# Patient Record
Sex: Female | Born: 1980 | Race: White | Hispanic: No | Marital: Married | Smoking: Current every day smoker
Health system: Southern US, Community
[De-identification: ages and names within clinical notes are randomized; demographics above are authoritative.]

## PROBLEM LIST (undated history)

## (undated) HISTORY — PX: TONSILLECTOMY: SUR1361

---

## 2003-01-30 ENCOUNTER — Emergency Department (HOSPITAL_COMMUNITY): Admission: EM | Admit: 2003-01-30 | Discharge: 2003-01-30 | Payer: Self-pay | Admitting: Emergency Medicine

## 2004-10-16 ENCOUNTER — Emergency Department (HOSPITAL_COMMUNITY): Admission: EM | Admit: 2004-10-16 | Discharge: 2004-10-16 | Payer: Self-pay | Admitting: Emergency Medicine

## 2004-10-18 ENCOUNTER — Emergency Department (HOSPITAL_COMMUNITY): Admission: EM | Admit: 2004-10-18 | Discharge: 2004-10-18 | Payer: Self-pay | Admitting: Emergency Medicine

## 2004-11-17 ENCOUNTER — Other Ambulatory Visit: Admission: RE | Admit: 2004-11-17 | Discharge: 2004-11-17 | Payer: Self-pay | Admitting: Obstetrics and Gynecology

## 2005-07-15 ENCOUNTER — Emergency Department (HOSPITAL_COMMUNITY): Admission: EM | Admit: 2005-07-15 | Discharge: 2005-07-15 | Payer: Self-pay | Admitting: Emergency Medicine

## 2006-02-11 ENCOUNTER — Emergency Department (HOSPITAL_COMMUNITY): Admission: EM | Admit: 2006-02-11 | Discharge: 2006-02-11 | Payer: Self-pay | Admitting: Emergency Medicine

## 2006-10-19 ENCOUNTER — Emergency Department (HOSPITAL_COMMUNITY): Admission: EM | Admit: 2006-10-19 | Discharge: 2006-10-19 | Payer: Self-pay | Admitting: Emergency Medicine

## 2009-12-05 ENCOUNTER — Emergency Department (HOSPITAL_COMMUNITY): Admission: EM | Admit: 2009-12-05 | Discharge: 2009-12-05 | Payer: Self-pay | Admitting: Emergency Medicine

## 2009-12-27 ENCOUNTER — Emergency Department (HOSPITAL_COMMUNITY): Admission: EM | Admit: 2009-12-27 | Discharge: 2009-12-28 | Payer: Self-pay | Admitting: Emergency Medicine

## 2010-05-25 ENCOUNTER — Emergency Department (HOSPITAL_COMMUNITY): Admission: EM | Admit: 2010-05-25 | Discharge: 2010-05-25 | Payer: Self-pay | Admitting: Emergency Medicine

## 2010-07-21 ENCOUNTER — Emergency Department (HOSPITAL_COMMUNITY): Admission: EM | Admit: 2010-07-21 | Discharge: 2010-07-21 | Payer: Self-pay | Admitting: Emergency Medicine

## 2010-09-16 ENCOUNTER — Emergency Department (HOSPITAL_COMMUNITY): Admission: EM | Admit: 2010-09-16 | Discharge: 2010-09-16 | Payer: Self-pay | Admitting: Emergency Medicine

## 2010-09-16 ENCOUNTER — Emergency Department (HOSPITAL_COMMUNITY): Admission: EM | Admit: 2010-09-16 | Discharge: 2010-09-17 | Payer: Self-pay | Admitting: Emergency Medicine

## 2011-08-27 ENCOUNTER — Other Ambulatory Visit: Payer: Self-pay | Admitting: Family Medicine

## 2011-08-27 DIAGNOSIS — R1011 Right upper quadrant pain: Secondary | ICD-10-CM

## 2011-08-28 ENCOUNTER — Other Ambulatory Visit: Payer: Self-pay

## 2011-09-02 ENCOUNTER — Ambulatory Visit
Admission: RE | Admit: 2011-09-02 | Discharge: 2011-09-02 | Disposition: A | Discharge status: Home / Self Care | Payer: 59 | Source: Ambulatory Visit | Attending: Family Medicine | Admitting: Family Medicine

## 2011-09-02 DIAGNOSIS — R1011 Right upper quadrant pain: Secondary | ICD-10-CM

## 2011-12-10 ENCOUNTER — Ambulatory Visit: Payer: 59

## 2013-04-23 ENCOUNTER — Emergency Department (HOSPITAL_COMMUNITY)
Admission: EM | Admit: 2013-04-23 | Discharge: 2013-04-23 | Disposition: A | Discharge status: Home / Self Care | Payer: 59 | Source: Home / Self Care

## 2014-10-31 ENCOUNTER — Emergency Department (HOSPITAL_COMMUNITY)
Admission: EM | Admit: 2014-10-31 | Discharge: 2014-10-31 | Disposition: A | Discharge status: Home / Self Care | Payer: 59 | Attending: Emergency Medicine | Admitting: Emergency Medicine

## 2014-10-31 ENCOUNTER — Emergency Department (HOSPITAL_COMMUNITY): Payer: 59

## 2014-10-31 ENCOUNTER — Encounter (HOSPITAL_COMMUNITY): Payer: Self-pay | Admitting: Emergency Medicine

## 2014-10-31 DIAGNOSIS — R197 Diarrhea, unspecified: Secondary | ICD-10-CM | POA: Diagnosis present

## 2014-10-31 DIAGNOSIS — K529 Noninfective gastroenteritis and colitis, unspecified: Secondary | ICD-10-CM

## 2014-10-31 DIAGNOSIS — M549 Dorsalgia, unspecified: Secondary | ICD-10-CM | POA: Insufficient documentation

## 2014-10-31 DIAGNOSIS — Z720 Tobacco use: Secondary | ICD-10-CM | POA: Diagnosis not present

## 2014-10-31 DIAGNOSIS — R630 Anorexia: Secondary | ICD-10-CM | POA: Diagnosis not present

## 2014-10-31 DIAGNOSIS — Z7982 Long term (current) use of aspirin: Secondary | ICD-10-CM | POA: Insufficient documentation

## 2014-10-31 DIAGNOSIS — Z79899 Other long term (current) drug therapy: Secondary | ICD-10-CM | POA: Diagnosis not present

## 2014-10-31 DIAGNOSIS — R102 Pelvic and perineal pain: Secondary | ICD-10-CM

## 2014-10-31 DIAGNOSIS — K921 Melena: Secondary | ICD-10-CM | POA: Insufficient documentation

## 2014-10-31 LAB — CBC WITH DIFFERENTIAL/PLATELET
Basophils Absolute: 0 10*3/uL (ref 0.0–0.1)
Basophils Relative: 0 % (ref 0–1)
Eosinophils Absolute: 0.2 10*3/uL (ref 0.0–0.7)
Eosinophils Relative: 2 % (ref 0–5)
HCT: 34.3 % — ABNORMAL LOW (ref 36.0–46.0)
Hemoglobin: 11.6 g/dL — ABNORMAL LOW (ref 12.0–15.0)
Lymphocytes Relative: 12 % (ref 12–46)
Lymphs Abs: 1.4 10*3/uL (ref 0.7–4.0)
MCH: 29.5 pg (ref 26.0–34.0)
MCHC: 33.8 g/dL (ref 30.0–36.0)
MCV: 87.3 fL (ref 78.0–100.0)
Monocytes Absolute: 0.5 10*3/uL (ref 0.1–1.0)
Monocytes Relative: 5 % (ref 3–12)
Neutro Abs: 9.1 10*3/uL — ABNORMAL HIGH (ref 1.7–7.7)
Neutrophils Relative %: 81 % — ABNORMAL HIGH (ref 43–77)
Platelets: 275 10*3/uL (ref 150–400)
RBC: 3.93 MIL/uL (ref 3.87–5.11)
RDW: 14.3 % (ref 11.5–15.5)
WBC: 11.2 10*3/uL — ABNORMAL HIGH (ref 4.0–10.5)

## 2014-10-31 LAB — COMPREHENSIVE METABOLIC PANEL
ALT: 10 U/L (ref 0–35)
AST: 11 U/L (ref 0–37)
Albumin: 3.5 g/dL (ref 3.5–5.2)
Alkaline Phosphatase: 66 U/L (ref 39–117)
Anion gap: 7 (ref 5–15)
BUN: 6 mg/dL (ref 6–23)
CO2: 26 mmol/L (ref 19–32)
Calcium: 8.9 mg/dL (ref 8.4–10.5)
Chloride: 108 mEq/L (ref 96–112)
Creatinine, Ser: 0.77 mg/dL (ref 0.50–1.10)
GFR calc Af Amer: >90 mL/min (ref >90)
GFR calc non Af Amer: >90 mL/min (ref >90)
Glucose, Bld: 103 mg/dL — ABNORMAL HIGH (ref 70–99)
Potassium: 4.3 mmol/L (ref 3.5–5.1)
Sodium: 141 mmol/L (ref 135–145)
Total Bilirubin: 0.6 mg/dL (ref 0.3–1.2)
Total Protein: 6.5 g/dL (ref 6.0–8.3)

## 2014-10-31 LAB — I-STAT BETA HCG BLOOD, ED (MC, WL, AP ONLY): I-stat hCG, quantitative: <5 m[IU]/mL (ref <5)

## 2014-10-31 LAB — POC OCCULT BLOOD, ED: Fecal Occult Bld: POSITIVE — AB

## 2014-10-31 LAB — LIPASE, BLOOD: Lipase: 31 U/L (ref 11–59)

## 2014-10-31 MED ORDER — TRAMADOL HCL 50 MG PO TABS: 50.0000 mg | ORAL_TABLET | Freq: Four times a day (QID) | ORAL | Status: DC | PRN

## 2014-10-31 MED ORDER — SODIUM CHLORIDE 0.9 % IV BOLUS (SEPSIS)
1000.0000 mL | Freq: Once | INTRAVENOUS | Status: AC
  Administered 2014-10-31: 1000 mL via INTRAVENOUS

## 2014-10-31 MED ORDER — IOHEXOL 300 MG/ML  SOLN: 25.0000 mL | Freq: Once | INTRAMUSCULAR | Status: DC | PRN

## 2014-10-31 MED ORDER — METRONIDAZOLE 500 MG PO TABS: 500.0000 mg | ORAL_TABLET | Freq: Two times a day (BID) | ORAL | Status: DC

## 2014-10-31 MED ORDER — ONDANSETRON HCL 4 MG/2ML IJ SOLN
4.0000 mg | Freq: Once | INTRAMUSCULAR | Status: AC | PRN
  Administered 2014-10-31: 4 mg via INTRAVENOUS
  Filled 2014-10-31: qty 2

## 2014-10-31 MED ORDER — ONDANSETRON HCL 4 MG/2ML IJ SOLN: 4.0000 mg | Freq: Once | INTRAMUSCULAR | Status: DC

## 2014-10-31 MED ORDER — MORPHINE SULFATE 4 MG/ML IJ SOLN
4.0000 mg | Freq: Once | INTRAMUSCULAR | Status: AC
  Administered 2014-10-31: 4 mg via INTRAVENOUS
  Filled 2014-10-31: qty 1

## 2014-10-31 MED ORDER — CIPROFLOXACIN HCL 500 MG PO TABS: 500.0000 mg | ORAL_TABLET | Freq: Two times a day (BID) | ORAL | Status: DC

## 2014-10-31 MED ORDER — PROMETHAZINE HCL 25 MG PO TABS
25.0000 mg | ORAL_TABLET | Freq: Four times a day (QID) | ORAL | Status: DC | PRN
Start: 2014-10-31 — End: 2018-08-07

## 2014-10-31 MED ORDER — NAPROXEN 500 MG PO TABS: 500.0000 mg | ORAL_TABLET | Freq: Two times a day (BID) | ORAL | Status: DC

## 2014-10-31 MED ORDER — IOHEXOL 300 MG/ML  SOLN
100.0000 mL | Freq: Once | INTRAMUSCULAR | Status: AC | PRN
  Administered 2014-10-31: 100 mL via INTRAVENOUS

## 2014-10-31 MED ORDER — DIPHENHYDRAMINE HCL 50 MG/ML IJ SOLN
25.0000 mg | Freq: Once | INTRAMUSCULAR | Status: AC
  Administered 2014-10-31: 25 mg via INTRAVENOUS
  Filled 2014-10-31: qty 1

## 2014-10-31 MED ORDER — METOCLOPRAMIDE HCL 5 MG/ML IJ SOLN
10.0000 mg | Freq: Once | INTRAMUSCULAR | Status: AC
  Administered 2014-10-31: 10 mg via INTRAVENOUS
  Filled 2014-10-31: qty 2

## 2014-10-31 NOTE — ED Notes (Signed)
Patient has not returned from US.  

## 2014-10-31 NOTE — ED Notes (Signed)
N/V/D and headache starting yesterday; 20 rounds of diarrhea since yesterday; was having diarrhea yesterday, but felt constipated, so took ex-lax yesterday. Bright red bleeding in stool. Lower abdominal cramping.  Took home pregnancy test yesterday and got positive results.

## 2014-10-31 NOTE — ED Notes (Signed)
Notified CT she drank a little more than half of the PO contrast and states she will not drink anymore.

## 2014-10-31 NOTE — ED Notes (Signed)
Patient returned from Ultrasound. 

## 2014-10-31 NOTE — ED Notes (Signed)
Went in to room to round and update vitals. Patient arguing with visitor in the room, states she is ready to leave. Offered to call CT and get a timeframe for her and she states to get the IV out, she is ready to go. Notified Amid[place]rin, [place]. She stated she would wait 5 more minutes maximum for CT. Transporter arrived at that time. She would not allow nurse to update vitals. States she is not putting cuff or finger probe back on.

## 2014-10-31 NOTE — ED Notes (Signed)
Patient transported to CT 

## 2014-10-31 NOTE — ED Provider Notes (Signed)
CSN: [id number]     Arrival date & time 10/31/14  [id number] History   First MD Initiated Contact with Patient 10/31/14 [phone]     Chief Complaint  Patient presents with  . Diarrhea     (Consider location/radiation/quality/duration/timing/severity/associated sxs/prior Treatment) HPI Pt is a 33yo female presenting to ED with c/o n/v/d that started yesterday.  Pt reports having diarrhea but also felt constipated so she took ex-lax.  Reports about 20 rounds of diarrhea since yesterday, 5-6 today.  Pt unsure if her symptoms seem to be improving or not.  Reports bright red blood in stool and lower abdominal cramping that is worse in pelvic area, 5/10 at rest, but 10/10 just prior to having to use bathroom.  Yesterday she had 3 episodes of non-bloody, non-bilious emesis, none today.  Denies sick contacts or recent travel. Denies recent use of antibiotics. LMP "last month" but reports taking a home pregnancy test yesterday, positive result.  States she has had 2 prior pregnancies w/o complications.  States she just had recent pelvic exam, tx for BV. Denies concern for STD.  History reviewed. No pertinent past medical history. History reviewed. No pertinent past surgical history. History reviewed. No pertinent family history. History  Substance Use Topics  . Smoking status: Current Every Day Smoker -- 0.50 packs/day    Types: Cigarettes  . Smokeless tobacco: Not on file  . Alcohol Use: No   OB History    No data available     Review of Systems  Constitutional: Positive for appetite change. Negative for fever, chills and fatigue.  Respiratory: Negative for cough and shortness of breath.   Cardiovascular: Negative for chest pain and palpitations.  Gastrointestinal: Positive for nausea, vomiting, abdominal pain, diarrhea, constipation and blood in stool. Negative for anal bleeding and rectal pain.  Genitourinary: Positive for pelvic pain. Negative for dysuria, urgency, hematuria, flank pain, decreased  urine volume, vaginal bleeding, vaginal discharge and vaginal pain.  Musculoskeletal: Positive for back pain. Negative for myalgias.  All other systems reviewed and are negative.     Allergies  Review of patient's allergies indicates no known allergies.  Home Medications   Prior to Admission medications   Medication Sig Start Date End Date Taking? Authorizing Provider  Aspirin-Salicylamide-Caffeine (BC HEADACHE POWDER PO) Take 1 packet by mouth daily as needed (headache).   Yes Historical Provider, MD  INTUNIV 1 MG TB24 Take 1 mg by mouth daily. 10/12/14  Yes Historical Provider, MD  omeprazole (PRILOSEC) 20 MG capsule Take 20 mg by mouth 2 (two) times daily as needed. 10/24/14  Yes Historical Provider, MD  propranolol (INDERAL) 10 MG tablet Take 10 mg by mouth 3 (three) times daily as needed (anxiety).  10/12/14  Yes Historical Provider, MD  traZODone (DESYREL) 50 MG tablet Take 50 mg by mouth at bedtime as needed. 08/30/14  Yes Historical Provider, MD  venlafaxine XR (EFFEXOR-XR) 150 MG 24 hr capsule Take 150 mg by mouth daily. 10/29/14  Yes Historical Provider, MD  ciprofloxacin (CIPRO) 500 MG tablet Take 1 tablet (500 mg total) by mouth 2 (two) times daily. One po bid x 7 days 10/31/14   Junius FinnerErin O'Malley, PA-C  metroNIDAZOLE (FLAGYL) 500 MG tablet Take 1 tablet (500 mg total) by mouth 2 (two) times daily. One po bid x 7 days 10/31/14   Junius FinnerErin O'Malley, PA-C  naproxen (NAPROSYN) 500 MG tablet Take 1 tablet (500 mg total) by mouth 2 (two) times daily. 10/31/14   Junius FinnerErin O'Malley, PA-C  promethazine (PHENERGAN) 25  MG tablet Take 1 tablet (25 mg total) by mouth every 6 (six) hours as needed for nausea or vomiting. 10/31/14   Junius FinnerErin O'Malley, PA-C  traMADol (ULTRAM) 50 MG tablet Take 1 tablet (50 mg total) by mouth every 6 (six) hours as needed. 10/31/14   Junius FinnerErin O'Malley, PA-C   BP 114/58 mmHg  Pulse 68  Temp(Src) 98.7 F (37.1 C) (Oral)  Resp 18  Ht 5\' 7"  (1.702 m)  Wt 209 lb (94.802 kg)  BMI  32.73 kg/m2  SpO2 98%  LMP  (Within Months) Physical Exam  Constitutional: She appears well-developed and well-nourished. No distress.  Pt lying in exam bed, NAD. Hydrated, non-toxic appearing.  HENT:  Head: Normocephalic and atraumatic.  Mouth/Throat: Mucous membranes are normal.  Eyes: Conjunctivae are normal. No scleral icterus.  Neck: Normal range of motion.  Cardiovascular: Normal rate, regular rhythm and normal heart sounds.   Pulmonary/Chest: Effort normal and breath sounds normal. No respiratory distress. She has no wheezes. She has no rales. She exhibits no tenderness.  Abdominal: Soft. She exhibits no distension and no mass. Bowel sounds are increased. There is tenderness. There is no rebound and no guarding.  Soft, non-distended, diffuse tenderness, worse in lower abdomen and LLQ  Genitourinary: Guaiac positive stool. Cervix exhibits no motion tenderness, no discharge and no friability. Right adnexum displays tenderness. Right adnexum displays no mass and no fullness. Left adnexum displays tenderness. Left adnexum displays no mass and no fullness. There is bleeding in the vagina. No erythema or tenderness in the vagina. No foreign body around the vagina. No signs of injury around the vagina. No vaginal discharge found.  Chaperoned exam. Normal external genital exam.  Vaginal canal: small to moderate amount of dark red blood.  No CMT, left and right adnexal tenderness w/o masses. Rectal exam: unremarkable, no gross red blood.  Musculoskeletal: Normal range of motion.  Neurological: She is alert.  Skin: Skin is warm and dry. She is not diaphoretic.  Nursing note and vitals reviewed.   ED Course  Procedures (including critical care time) Labs Review Labs Reviewed  CBC WITH DIFFERENTIAL - Abnormal; Notable for the following:    WBC 11.2 (*)    Hemoglobin 11.6 (*)    HCT 34.3 (*)    Neutrophils Relative % 81 (*)    Neutro Abs 9.1 (*)    All other components within normal  limits  COMPREHENSIVE METABOLIC PANEL - Abnormal; Notable for the following:    Glucose, Bld 103 (*)    All other components within normal limits  POC OCCULT BLOOD, ED - Abnormal; Notable for the following:    Fecal Occult Bld POSITIVE (*)    All other components within normal limits  LIPASE, BLOOD  I-STAT BETA HCG BLOOD, ED ([facility], [facility], [facility] ONLY)    Imaging Review [place] Transvaginal Non-ob  10/31/2014   CLINICAL DATA:  Pelvic pain since yesterday. Patient reports positive home pregnancy test, however negative beta HCG.  EXAM: TRANSABDOMINAL AND TRANSVAGINAL ULTRASOUND OF PELVIS  TECHNIQUE: Both transabdominal and transvaginal ultrasound examinations of the pelvis were performed. Transabdominal technique was performed for global imaging of the pelvis including uterus, ovaries, adnexal regions, and pelvic cul-de-sac. It was necessary to proceed with endovaginal exam following the transabdominal exam to visualize the uterus and both ovaries.  COMPARISON:  None  FINDINGS: Uterus  Measurements: 7.8 x 4.1 x 5.5 cm. No fibroids or other mass visualized.  Endometrium  Thickness: 5-7 mm. No focal abnormality visualized. There is no fluid  in the endometrial canal. No intrauterine gestational sac.  Right ovary  Measurements: 3.1 x 2.0 x 2.3 cm. Normal appearance/no adnexal mass. Normal blood flow is seen.  Left ovary  Measurements: 3.6 x 3.1 x 2.1 cm. Normal appearance/no adnexal mass. Normal blood flow is seen.  Other findings  No free fluid.  IMPRESSION: Normal pelvic ultrasound.   Electronically Signed   By: Rubye Oaks M.D.   On: 10/31/2014 12:38   US Pelvis Complete  10/31/2014   CLINICAL DATA:  Pelvic pain since yesterday. Patient reports positive home pregnancy test, however negative beta HCG.  EXAM: TRANSABDOMINAL AND TRANSVAGINAL ULTRASOUND OF PELVIS  TECHNIQUE: Both transabdominal and transvaginal ultrasound examinations of the pelvis were performed. Transabdominal technique was performed for global  imaging of the pelvis including uterus, ovaries, adnexal regions, and pelvic cul-de-sac. It was necessary to proceed with endovaginal exam following the transabdominal exam to visualize the uterus and both ovaries.  COMPARISON:  None  FINDINGS: Uterus  Measurements: 7.8 x 4.1 x 5.5 cm. No fibroids or other mass visualized.  Endometrium  Thickness: 5-7 mm. No focal abnormality visualized. There is no fluid in the endometrial canal. No intrauterine gestational sac.  Right ovary  Measurements: 3.1 x 2.0 x 2.3 cm. Normal appearance/no adnexal mass. Normal blood flow is seen.  Left ovary  Measurements: 3.6 x 3.1 x 2.1 cm. Normal appearance/no adnexal mass. Normal blood flow is seen.  Other findings  No free fluid.  IMPRESSION: Normal pelvic ultrasound.   Electronically Signed   By: Rubye Oaks M.D.   On: 10/31/2014 12:38   Ct Abdomen Pelvis W Contrast  10/31/2014   CLINICAL DATA:  Nausea, vomiting and diarrhea beginning 1 day ago.  EXAM: CT ABDOMEN AND PELVIS WITH CONTRAST  TECHNIQUE: Multidetector CT imaging of the abdomen and pelvis was performed using the standard protocol following bolus administration of intravenous contrast.  CONTRAST:  100 mL OMNIPAQUE IOHEXOL 300 MG/ML  SOLN  COMPARISON:  None.  FINDINGS: The lung bases are clear. No pleural or pericardial effusion. Heart size is normal.  The gallbladder, liver, spleen, adrenal glands, pancreas and kidneys appear normal. The walls of the descending colon are thickened throughout with surrounding inflammatory change. The remainder of the colon, including the sigmoid, appears spared. The appendix, stomach and small bowel are unremarkable. A small amount of free pelvic fluid is identified. There is no abscess, pneumatosis, portal venous gas or free intraperitoneal air. No lymphadenopathy is identified. Uterus, adnexa and urinary bladder appear normal. No focal bony abnormality is identified. A few small foci of calcific atherosclerosis are seen in the  descending abdominal aorta.  IMPRESSION: Findings consistent with infectious or inflammatory colitis of the descending colon. Negative for abscess or perforation.  Mild calcific aortic atherosclerosis.   Electronically Signed   By: Drusilla Kanner M.D.   On: 10/31/2014 14:44     EKG Interpretation None      MDM   Final diagnoses:  Pelvic pain in female  Diarrhea  Hematochezia  Gastroenteritis    Pt is a 33yo female presenting to ED with c/o abdominal pain, n/v/d that started yesterday as well as positive home pregnancy test. Pt felt constipated while having the diarrhea, reports bright red blood in stool.  Pt reported noticing vaginal discharge in ED while giving urine sample. On exam, pt has diffuse abdominal tenderness, worse in lower abdomen and LLQ.   DDx: ectopic pregnancy, TOA, ovarian torsion, diverticulitis, gastroenteritis, SBO lower concern as abd is soft and  non-distended.  istat beta Hcg- negative.  Pt may have had a false positive home pregnancy test.  Pelvic exam: significant for small to moderate amount of dark red blood.  Will still get pelvic U/S.  While in ED pt had had 2 BMs, c/o severe pain with her BMs.  Hemoccult: positive. CT abd ordered as pt does not appear to be pregnant at this time and still having severe abdominal pain.   Pelvic U/S: normal  CT abd: findings c/w infectious or inflammatory colitis of descending colon. Negative for abscess or perforation.   Due to pt's pain and frequency of diarrhea, will tx as infectious gastroenteritis. Pt is hemodynamically stable for discharge home. She has been able to keep down PO fluids, no vomiting in ED.  Rx: cipro, flagyl, phenergan, tramadol and naproxen.  Discussed with pt to repeat a home pregnancy test or have PCP repeat test in 1-2 weeks as blood test in ED was negative today. Home care instructions provided. Return precautions provided. Pt verbalized understanding and agreement with tx plan.   Junius Finner,  PA-C 10/31/14 1604  Rolan Bucco, MD 10/31/14 [phone]

## 2014-10-31 NOTE — ED Notes (Signed)
Given PO contrast to drink.

## 2014-10-31 NOTE — ED Notes (Signed)
Patient returned from CT

## 2014-10-31 NOTE — ED Notes (Signed)
Patient transported to Ultrasound 

## 2014-12-07 ENCOUNTER — Telehealth: Payer: Self-pay

## 2014-12-07 NOTE — Telephone Encounter (Signed)
Patient had called for directions for GI appointment Front desk had already accommodated their request

## 2015-02-09 ENCOUNTER — Encounter (HOSPITAL_COMMUNITY): Payer: Self-pay | Admitting: Adult Health

## 2015-02-09 ENCOUNTER — Emergency Department (HOSPITAL_COMMUNITY)
Admission: EM | Admit: 2015-02-09 | Discharge: 2015-02-10 | Disposition: A | Discharge status: Home / Self Care | Payer: 59 | Attending: Emergency Medicine | Admitting: Emergency Medicine

## 2015-02-09 DIAGNOSIS — Z79899 Other long term (current) drug therapy: Secondary | ICD-10-CM | POA: Diagnosis not present

## 2015-02-09 DIAGNOSIS — Z791 Long term (current) use of non-steroidal anti-inflammatories (NSAID): Secondary | ICD-10-CM | POA: Insufficient documentation

## 2015-02-09 DIAGNOSIS — W228XXA Striking against or struck by other objects, initial encounter: Secondary | ICD-10-CM | POA: Diagnosis not present

## 2015-02-09 DIAGNOSIS — Z8659 Personal history of other mental and behavioral disorders: Secondary | ICD-10-CM | POA: Insufficient documentation

## 2015-02-09 DIAGNOSIS — Z23 Encounter for immunization: Secondary | ICD-10-CM | POA: Diagnosis not present

## 2015-02-09 DIAGNOSIS — S01112A Laceration without foreign body of left eyelid and periocular area, initial encounter: Secondary | ICD-10-CM | POA: Diagnosis not present

## 2015-02-09 DIAGNOSIS — Y939 Activity, unspecified: Secondary | ICD-10-CM | POA: Diagnosis not present

## 2015-02-09 DIAGNOSIS — Y929 Unspecified place or not applicable: Secondary | ICD-10-CM | POA: Insufficient documentation

## 2015-02-09 DIAGNOSIS — S0512XA Contusion of eyeball and orbital tissues, left eye, initial encounter: Secondary | ICD-10-CM | POA: Diagnosis not present

## 2015-02-09 DIAGNOSIS — Z720 Tobacco use: Secondary | ICD-10-CM | POA: Insufficient documentation

## 2015-02-09 DIAGNOSIS — Y999 Unspecified external cause status: Secondary | ICD-10-CM | POA: Diagnosis not present

## 2015-02-09 DIAGNOSIS — K219 Gastro-esophageal reflux disease without esophagitis: Secondary | ICD-10-CM | POA: Insufficient documentation

## 2015-02-09 DIAGNOSIS — S0592XA Unspecified injury of left eye and orbit, initial encounter: Secondary | ICD-10-CM | POA: Diagnosis present

## 2015-02-09 MED ORDER — TETANUS-DIPHTH-ACELL PERTUSSIS 5-2.5-18.5 LF-MCG/0.5 IM SUSP
0.5000 mL | Freq: Once | INTRAMUSCULAR | Status: AC
  Administered 2015-02-10: 0.5 mL via INTRAMUSCULAR
  Filled 2015-02-09: qty 0.5
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
## 2015-02-10 MED ORDER — HYDROCODONE-ACETAMINOPHEN 5-325 MG PO TABS
1.0000 | ORAL_TABLET | Freq: Once | ORAL | Status: AC
  Administered 2015-02-10: 1 via ORAL
  Filled 2015-02-10: qty 1

## 2015-02-10 MED ORDER — LIDOCAINE-EPINEPHRINE (PF) 2 %-1:200000 IJ SOLN
10.0000 mL | Freq: Once | INTRAMUSCULAR | Status: AC
  Administered 2015-02-10: 10 mL
  Filled 2015-02-10: qty 20

## 2015-02-10 MED ORDER — TRAMADOL HCL 50 MG PO TABS: 50.0000 mg | ORAL_TABLET | Freq: Four times a day (QID) | ORAL | Status: DC | PRN
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
## 2015-02-10 NOTE — Discharge Instructions (Signed)
Facial Laceration ° A facial laceration is a cut on the face. These injuries can be painful and cause bleeding. Lacerations usually heal quickly, but they need special care to reduce scarring. °DIAGNOSIS  °Your health care provider will take a medical history, ask for details about how the injury occurred, and examine the wound to determine how deep the cut is. °TREATMENT  °Some facial lacerations may not require closure. Others may not be able to be closed because of an increased risk of infection. The risk of infection and the chance for successful closure will depend on various factors, including the amount of time since the injury occurred. °The wound may be cleaned to help prevent infection. If closure is appropriate, pain medicines may be given if needed. Your health care provider will use stitches (sutures), wound glue (adhesive), or skin adhesive strips to repair the laceration. These tools bring the skin edges together to allow for faster healing and a better cosmetic outcome. If needed, you may also be given a tetanus shot. °HOME CARE INSTRUCTIONS °· Only take over-the-counter or prescription medicines as directed by your health care provider. °· Follow your health care provider's instructions for wound care. These instructions will vary depending on the technique used for closing the wound. °For Sutures: °· Keep the wound clean and dry.   °· If you were given a bandage (dressing), you should change it at least once a day. Also change the dressing if it becomes wet or dirty, or as directed by your health care provider.   °· Wash the wound with soap and water 2 times a day. Rinse the wound off with water to remove all soap. Pat the wound dry with a clean towel.   °· After cleaning, apply a thin layer of the antibiotic ointment recommended by your health care provider. This will help prevent infection and keep the dressing from sticking.   °· You may shower as usual after the first 24 hours. Do not soak the  wound in water until the sutures are removed.   °· Get your sutures removed as directed by your health care provider. With facial lacerations, sutures should usually be taken out after 4-5 days to avoid stitch marks.   °· Wait a few days after your sutures are removed before applying any makeup. °For Skin Adhesive Strips: °· Keep the wound clean and dry.   °· Do not get the skin adhesive strips wet. You may bathe carefully, using caution to keep the wound dry.   °· If the wound gets wet, pat it dry with a clean towel.   °· Skin adhesive strips will fall off on their own. You may trim the strips as the wound heals. Do not remove skin adhesive strips that are still stuck to the wound. They will fall off in time.   °For Wound Adhesive: °· You may briefly wet your wound in the shower or bath. Do not soak or scrub the wound. Do not swim. Avoid periods of heavy sweating until the skin adhesive has fallen off on its own. After showering or bathing, gently pat the wound dry with a clean towel.   °· Do not apply liquid medicine, cream medicine, ointment medicine, or makeup to your wound while the skin adhesive is in place. This may loosen the film before your wound is healed.   °· If a dressing is placed over the wound, be careful not to apply tape directly over the skin adhesive. This may cause the adhesive to be pulled off before the wound is healed.   °· Avoid   prolonged exposure to sunlight or tanning lamps while the skin adhesive is in place.  The skin adhesive will usually remain in place for 5-10 days, then naturally fall off the skin. Do not pick at the adhesive film.  After Healing: Once the wound has healed, cover the wound with sunscreen during the day for 1 full year. This can help minimize scarring. Exposure to ultraviolet light in the first year will darken the scar. It can take 1-2 years for the scar to lose its redness and to heal completely.  SEEK IMMEDIATE MEDICAL CARE IF:  You have redness, pain, or  swelling around the wound.   You see ayellowish-white fluid (pus) coming from the wound.   You have chills or a fever.  MAKE SURE YOU:  Understand these instructions.  Will watch your condition.  Will get help right away if you are not doing well or get worse. Document Released: 12/03/2004 Document Revised: 08/16/2013 Document Reviewed: 06/08/2013 [facility] Patient Information 2015 [place], [place]. This information is not intended to replace advice given to you by your health care provider. Make sure you discuss any questions you have with your health care provider.  Facial or Scalp Contusion A facial or scalp contusion is a deep bruise on the face or head. Injuries to the face and head generally cause a lot of swelling, especially around the eyes. Contusions are the result of an injury that caused bleeding under the skin. The contusion may turn blue, purple, or yellow. Minor injuries will give you a painless contusion, but more severe contusions may stay painful and swollen for a few weeks.  CAUSES  A facial or scalp contusion is caused by a blunt injury or trauma to the face or head area.  SIGNS AND SYMPTOMS   Swelling of the injured area.   Discoloration of the injured area.   Tenderness, soreness, or pain in the injured area.  DIAGNOSIS  The diagnosis can be made by taking a medical history and doing a physical exam. An X-ray exam, CT scan, or MRI may be needed to determine if there are any associated injuries, such as broken bones (fractures). TREATMENT  Often, the best treatment for a facial or scalp contusion is applying cold compresses to the injured area. Over-the-counter medicines may also be recommended for pain control.  HOME CARE INSTRUCTIONS   Only take over-the-counter or prescription medicines as directed by your health care provider.   Apply ice to the injured area.   Put ice in a plastic bag.   Place a towel between your skin and the bag.   Leave  the ice on for 20 minutes, 2-3 times a day.  SEEK MEDICAL CARE IF:  You have bite problems.   You have pain with chewing.   You are concerned about facial defects. SEEK IMMEDIATE MEDICAL CARE IF:  You have severe pain or a headache that is not relieved by medicine.   You have unusual sleepiness, confusion, or personality changes.   You throw up (vomit).   You have a persistent nosebleed.   You have double vision or blurred vision.   You have fluid drainage from your nose or ear.   You have difficulty walking or using your arms or legs.  MAKE SURE YOU:   Understand these instructions.  Will watch your condition.  Will get help right away if you are not doing well or get worse. Document Released: 12/03/2004 Document Revised: 08/16/2013 Document Reviewed: 06/08/2013 [facility] Patient Information 2015 [place], [place].  This information is not intended to replace advice given to you by your health care provider. Make sure you discuss any questions you have with your health care provider.  Hematoma A hematoma is a collection of blood under the skin, in an organ, in a body space, in a joint space, or in other tissue. The blood can clot to form a lump that you can see and feel. The lump is often firm and may sometimes become sore and tender. Most hematomas get better in a few days to weeks. However, some hematomas may be serious and require medical care. Hematomas can range in size from very small to very large. CAUSES  A hematoma can be caused by a blunt or penetrating injury. It can also be caused by spontaneous leakage from a blood vessel under the skin. Spontaneous leakage from a blood vessel is more likely to occur in older people, especially those taking blood thinners. Sometimes, a hematoma can develop after certain medical procedures. SIGNS AND SYMPTOMS   A firm lump on the body.  Possible pain and tenderness in the area.  Bruising.Blue, dark blue, purple-red, or  yellowish skin may appear at the site of the hematoma if the hematoma is close to the surface of the skin. For hematomas in deeper tissues or body spaces, the signs and symptoms may be subtle. For example, an intra-abdominal hematoma may cause abdominal pain, weakness, fainting, and shortness of breath. An intracranial hematoma may cause a headache or symptoms such as weakness, trouble speaking, or a change in consciousness. DIAGNOSIS  A hematoma can usually be diagnosed based on your medical history and a physical exam. Imaging tests may be needed if your health care provider suspects a hematoma in deeper tissues or body spaces, such as the abdomen, head, or chest. These tests may include ultrasonography or a CT scan.  TREATMENT  Hematomas usually go away on their own over time. Rarely does the blood need to be drained out of the body. Large hematomas or those that may affect vital organs will sometimes need surgical drainage or monitoring. HOME CARE INSTRUCTIONS   Apply ice to the injured area:   Put ice in a plastic bag.   Place a towel between your skin and the bag.   Leave the ice on for 20 minutes, 2-3 times a day for the first 1 to 2 days.   After the first 2 days, switch to using warm compresses on the hematoma.   Elevate the injured area to help decrease pain and swelling. Wrapping the area with an elastic bandage may also be helpful. Compression helps to reduce swelling and promotes shrinking of the hematoma. Make sure the bandage is not wrapped too tight.   If your hematoma is on a lower extremity and is painful, crutches may be helpful for a couple days.   Only take over-the-counter or prescription medicines as directed by your health care provider. SEEK IMMEDIATE MEDICAL CARE IF:   You have increasing pain, or your pain is not controlled with medicine.   You have a fever.   You have worsening swelling or discoloration.   Your skin over the hematoma breaks or  starts bleeding.   Your hematoma is in your chest or abdomen and you have weakness, shortness of breath, or a change in consciousness.  Your hematoma is on your scalp (caused by a fall or injury) and you have a worsening headache or a change in alertness or consciousness. MAKE SURE YOU:   Understand  these instructions.  Will watch your condition.  Will get help right away if you are not doing well or get worse. Document Released: 06/09/2004 Document Revised: 06/28/2013 Document Reviewed: 04/05/2013 [facility] Patient Information 2015 [place], [place]. This information is not intended to replace advice given to you by your health care provider. Make sure you discuss any questions you have with your health care provider.

## 2016-01-24 ENCOUNTER — Emergency Department (HOSPITAL_COMMUNITY)
Admission: EM | Admit: 2016-01-24 | Discharge: 2016-01-24 | Disposition: A | Discharge status: Home / Self Care | Payer: 59 | Attending: Emergency Medicine | Admitting: Emergency Medicine

## 2016-01-24 ENCOUNTER — Encounter (HOSPITAL_COMMUNITY): Payer: Self-pay | Admitting: *Deleted

## 2016-01-24 DIAGNOSIS — R6889 Other general symptoms and signs: Secondary | ICD-10-CM

## 2016-01-24 DIAGNOSIS — Z880 Allergy status to penicillin: Secondary | ICD-10-CM | POA: Insufficient documentation

## 2016-01-24 DIAGNOSIS — Z791 Long term (current) use of non-steroidal anti-inflammatories (NSAID): Secondary | ICD-10-CM | POA: Diagnosis not present

## 2016-01-24 DIAGNOSIS — R05 Cough: Secondary | ICD-10-CM | POA: Diagnosis not present

## 2016-01-24 DIAGNOSIS — J029 Acute pharyngitis, unspecified: Secondary | ICD-10-CM | POA: Diagnosis not present

## 2016-01-24 DIAGNOSIS — Z79899 Other long term (current) drug therapy: Secondary | ICD-10-CM | POA: Insufficient documentation

## 2016-01-24 DIAGNOSIS — R110 Nausea: Secondary | ICD-10-CM | POA: Insufficient documentation

## 2016-01-24 DIAGNOSIS — F17210 Nicotine dependence, cigarettes, uncomplicated: Secondary | ICD-10-CM | POA: Diagnosis not present

## 2016-01-24 DIAGNOSIS — R6883 Chills (without fever): Secondary | ICD-10-CM | POA: Insufficient documentation

## 2016-01-24 DIAGNOSIS — R51 Headache: Secondary | ICD-10-CM | POA: Diagnosis not present

## 2016-01-24 DIAGNOSIS — M791 Myalgia: Secondary | ICD-10-CM | POA: Diagnosis not present

## 2016-01-24 DIAGNOSIS — R197 Diarrhea, unspecified: Secondary | ICD-10-CM | POA: Diagnosis not present

## 2016-01-24 MED ORDER — IBUPROFEN 800 MG PO TABS: 800.0000 mg | ORAL_TABLET | Freq: Three times a day (TID) | ORAL | Status: DC

## 2016-01-24 MED ORDER — ONDANSETRON 4 MG PO TBDP: 4.0000 mg | ORAL_TABLET | Freq: Three times a day (TID) | ORAL | Status: DC | PRN

## 2016-01-24 MED ORDER — BENZONATATE 100 MG PO CAPS: 100.0000 mg | ORAL_CAPSULE | Freq: Three times a day (TID) | ORAL | Status: DC

## 2016-01-24 MED ORDER — KETOROLAC TROMETHAMINE 60 MG/2ML IM SOLN
60.0000 mg | Freq: Once | INTRAMUSCULAR | Status: AC
  Administered 2016-01-24: 60 mg via INTRAMUSCULAR
  Filled 2016-01-24: qty 2

## 2016-01-24 NOTE — ED Provider Notes (Signed)
CSN: [id number]     Arrival date & time 01/24/16  1530 History  By signing my name below, I, Tina Warren, attest that this documentation has been prepared under the direction and in the presence of Raiyah Speakman, PA-C. Electronically Signed: Tanda Warren, ED Scribe. 01/24/2016. 4:45 PM.   Chief Complaint  Patient presents with  . Generalized Body Aches  . Nausea   The history is provided by the patient. No language interpreter was used.     HPI Comments: Tina Warren is a 35 y.o. female who presents to the Emergency Department complaining of gradual onset, constant, body aches x 1 day. Pt also complains of chills, sore throat, nonproductive cough, nausea, diarrhea, and headache. Headache is bilateral frontal, rates it 7/10, nonradiating. She estimates 3-4 episodes of diarrhea in the past 24 hours. Pt has been taking Tylenol without relief. Recent sick contact with similar symptoms. Denies vomiting, dysuria, abdominal pain, shortness of breath, chest pain, or any other associated symptoms.   History reviewed. No pertinent past medical history. History reviewed. No pertinent past surgical history. No family history on file. Social History  Substance Use Topics  . Smoking status: Current Every Day Smoker -- 0.50 packs/day    Types: Cigarettes  . Smokeless tobacco: None  . Alcohol Use: No   OB History    No data available     Review of Systems  Constitutional: Positive for chills. Negative for fever.  HENT: Positive for sore throat.   Respiratory: Positive for cough.   Gastrointestinal: Positive for nausea and diarrhea. Negative for vomiting.  Genitourinary: Negative for dysuria.  Musculoskeletal: Positive for myalgias.  Neurological: Positive for headaches.  All other systems reviewed and are negative.  Allergies  Penicillins  Home Medications   Prior to Admission medications   Medication Sig Start Date End Date Taking? Authorizing Provider  Aspirin-Salicylamide-Caffeine  (BC HEADACHE POWDER PO) Take 1 packet by mouth daily as needed (headache).    Historical Provider, MD  benzonatate (TESSALON) 100 MG capsule Take 1 capsule (100 mg total) by mouth every 8 (eight) hours. 01/24/16   Maleek Craver C Lindora Alviar, PA-C  ciprofloxacin (CIPRO) 500 MG tablet Take 1 tablet (500 mg total) by mouth 2 (two) times daily. One po bid x 7 days 10/31/14   Junius Finner, PA-C  ibuprofen (ADVIL,MOTRIN) 800 MG tablet Take 1 tablet (800 mg total) by mouth 3 (three) times daily. 01/24/16   Harshal Sirmon C Teonia Yager, PA-C  INTUNIV 1 MG TB24 Take 1 mg by mouth daily. 10/12/14   Historical Provider, MD  metroNIDAZOLE (FLAGYL) 500 MG tablet Take 1 tablet (500 mg total) by mouth 2 (two) times daily. One po bid x 7 days 10/31/14   Junius Finner, PA-C  naproxen (NAPROSYN) 500 MG tablet Take 1 tablet (500 mg total) by mouth 2 (two) times daily. 10/31/14   Junius Finner, PA-C  omeprazole (PRILOSEC) 20 MG capsule Take 20 mg by mouth 2 (two) times daily as needed. 10/24/14   Historical Provider, MD  ondansetron (ZOFRAN ODT) 4 MG disintegrating tablet Take 1 tablet (4 mg total) by mouth every 8 (eight) hours as needed for nausea or vomiting. 01/24/16   Wilgus Deyton C Cassidy Tashiro, PA-C  promethazine (PHENERGAN) 25 MG tablet Take 1 tablet (25 mg total) by mouth every 6 (six) hours as needed for nausea or vomiting. 10/31/14   Junius Finner, PA-C  propranolol (INDERAL) 10 MG tablet Take 10 mg by mouth 3 (three) times daily as needed (anxiety).  10/12/14   Historical Provider,  MD  traMADol (ULTRAM) 50 MG tablet Take 1 tablet (50 mg total) by mouth every 6 (six) hours as needed. 10/31/14   Junius FinnerErin O'Malley, PA-C  traMADol (ULTRAM) 50 MG tablet Take 1 tablet (50 mg total) by mouth every 6 (six) hours as needed. 02/10/15   Tiffany Neva SeatGreene, PA-C  traZODone (DESYREL) 50 MG tablet Take 50 mg by mouth at bedtime as needed. 08/30/14   Historical Provider, MD  venlafaxine XR (EFFEXOR-XR) 150 MG 24 hr capsule Take 150 mg by mouth daily. 10/29/14   Historical Provider, MD    BP 104/64 mmHg  Pulse 80  Temp(Src) 97.9 F (36.6 C) (Oral)  Resp 18  SpO2 100%  LMP 01/16/2016   Physical Exam  Constitutional: She is oriented to person, place, and time. She appears well-developed and well-nourished. No distress.  HENT:  Head: Normocephalic and atraumatic.  Mouth/Throat: Oropharynx is clear and moist.  Eyes: Conjunctivae and EOM are normal. Pupils are equal, round, and reactive to light.  Neck: Normal range of motion. Neck supple.  No cervical lymphadenopathy.   Cardiovascular: Normal rate, regular rhythm, normal heart sounds and intact distal pulses.   Pulmonary/Chest: Effort normal and breath sounds normal. No respiratory distress. She has no wheezes. She has no rhonchi. She has no rales.  Abdominal: Soft. Bowel sounds are normal. There is no tenderness. There is no guarding.  Musculoskeletal: She exhibits no edema or tenderness.  Lymphadenopathy:    She has no cervical adenopathy.  Neurological: She is alert and oriented to person, place, and time. She has normal reflexes.  No sensory deficits. Strength 5/5 in all extremities. No gait disturbance. Coordination intact. Cranial nerves III-XII grossly intact. No facial droop.   Skin: Skin is warm and dry. She is not diaphoretic.  Psychiatric: She has a normal mood and affect. Her behavior is normal.  Nursing note and vitals reviewed.   ED Course  Procedures (including critical care time)  DIAGNOSTIC STUDIES: Oxygen Saturation is 100% on RA, normal by my interpretation.    COORDINATION OF CARE: 4:43 PM-Discussed treatment plan which includes symptomatic treatment with pt at bedside and pt agreed to plan.   Labs Review Labs Reviewed - No data to display  Imaging Review No results found.   EKG Interpretation None      MDM   Final diagnoses:  Flu-like symptoms   Tina SinclairSara Warren presents with flulike symptoms since yesterday.  Patient's presentation is consistent with a viral illness, such as  influenza. No indications for imaging or labs at this time. Patient is nontoxic appearing, afebrile, not tachycardic, not tachypneic, maintains SPO2 of 100% on room air, and is in no apparent distress. Patient has no signs of sepsis or other serious or life-threatening condition. Home symptomatic care and return precautions discussed. Patient voiced understanding of these instructions and is comfortable with discharge.  I personally performed the services described in this documentation, which was scribed in my presence. The recorded information has been reviewed and is accurate.   Anselm PancoastShawn C Nyrah Demos, PA-C 01/24/16 1719  Linwood DibblesJon Knapp, MD 01/25/16 1349

## 2016-01-24 NOTE — ED Notes (Signed)
Yesterday developed body aches, sore throat, nausea

## 2016-01-24 NOTE — Discharge Instructions (Signed)
You have been seen today for flulike symptoms. Your symptoms are consistent with a viral illness. Viruses do not require antibiotics. Treatment is symptomatic care. Drink plenty of fluids and get plenty of rest. Ibuprofen or Tylenol for pain or fever. Zofran for nausea. Tessalon for cough. Plain Mucinex may help relieve congestion. Warm liquids or Chloraseptic spray may help soothe the sore throat. Follow up with PCP as needed. Return to ED should symptoms worsen.  RESOURCE GUIDE  Chronic Pain Problems: Contact [facility]  [phone] Patients need to be referred by their primary care doctor.  Insufficient Money for Medicine: Contact United Way:  call "211" or [facility] [phone].  No Primary Care Doctor: - Call Health Connect  [phone] - can help you locate a primary care doctor that  accepts your insurance, provides certain services, etc. - Physician Referral Service- [phone]  Agencies that provide inexpensive medical care: - [facility]  [phone] - [facility]  [phone] - [facility]  [phone] - Women's Clinic  [phone] - Planned Parenthood  [phone] Haynes Bast[facility]  [phone]  Medicaid-accepting [facility] Providers: - [facility]- [address], [address]  [phone], Mon-Fri 9am-7pm, Sat 9am-1pm - [facility]- [address], [address] [place]  [phone] - [facility]- [address], [address] [place]  [phone] [facility]- [address]  [phone] - Renaye RakersVeita Bland- [address], [address], [phone]  Only accepts [place] patients after they have their name  applied to their card  Self Pay (no insurance) in [place]: - Sickle Cell Patients: Dr Willey BladeEric Dean, [facility]  [address], [phone] - [facility]- [address]  [phone]        Redge Gainer-     [facility]- [address], [address]       -     [facility]- see information above (Speak to CitigroupPam H if you do not have insurance)       -  [facility]- [address], [phone]       -  [facility]- [address],  [phone]       -  [facility]- [address], [phone]       -  Dr Julio Sickssei-Bonsu-  [address], [address], [place], [phone]       -  [facility]- [address], [phone]       -  [facility]- [address], [phone], also [address], [phone]       -    [place] [facility]- [address], [phone], 1st & 3rd Saturday   every month, 10am-1pm  1) Find a Doctor and Pay Out of Pocket Although you won't have to find out who is covered by your insurance plan, it is a good idea to ask around and get recommendations. You will then need to call the office and see if the doctor you have chosen will accept you as a new patient and what types of options they offer for patients who are self-pay. Some doctors offer discounts or will set up payment plans for their patients who do not have insurance, but you will need to ask so you aren't surprised  when you get to your appointment.  2) Contact Your Local Health Department Not all health departments have doctors that can see patients for sick visits, but many do, so it is worth a call to see if yours does. If you don't know where your local health department is, you can check in your phone book. The CDC also has a tool to help you locate your state's health department, and many state websites also have listings of all of their local health departments.  3) Find a Walk-in Clinic If your illness is not likely to be very severe or complicated, you may want to try a walk in clinic. These are popping up all over the country in pharmacies, drugstores, and shopping centers. They're usually staffed by nurse practitioners or  physician assistants that have been trained to treat common illnesses and complaints. They're usually fairly quick and inexpensive. However, if you have serious medical issues or chronic medical problems, these are probably not your best option  STD Testing - [facility], STD Clinic, [address] [place], phone [phone] or [phone].  Monday - Friday, call for an appointment. [facility] [place] [facility] [employer], STD Clinic, [address], [place], phone [phone] or [phone].  Monday - Friday, call for an appointment.  Abuse/Neglect: [facility] Child Abuse Hotline 351-229-5404 [facility] Child Abuse Hotline 418-146-9241 (After Hours)  Emergency Shelter:  [facility] [phone]  Maternity Homes: - [facility] [phone] - [facility] [phone]  MRSA Hotline #:   318 171 2455  [facility] Resources  [facility]  United [employer] [facility] Dept. [address]                 [address]         [address]  Blondell Reveal Phone:  [phone]                                  Phone:  [phone]                   Phone:  [phone]  [facility], [phone] - [phone]       -     [facility] in [place], [address],                                  [phone], [facility] Child Abuse Hotline 202-304-2926 or [phone]) [phone] (After Hours)   Behavioral Health Services  Substance Abuse Resources: - [facility]  [phone] - [facility] [phone] - [facility] [phone] Floydene Flock [phone] - Residential & Outpatient  Substance Abuse Program  [phone]  Psychological  Services: - [place], [phone] [address] [place], [place]: [phone] or [phone], PicCapture.uy  Dental Assistance  If unable to pay or uninsured, contact:  [facility] or [facility]. to become qualified for the adult dental clinic.  Patients with Medicaid: [facility] [phone], [place] [address], [phone]  If unable to pay, or uninsured, contact [facility] [phone]) or [place] [phone] in [place], [place] in [facility]) to become qualified for the adult dental clinic   Other [place]- [place], [place], [place], [address], [place], [place], 2nd and 4th Thursday of the month at 6:30am.  10 clients each day by appointment, can sometimes see walk-in patients if someone does not show for an appointment. [facility]- [address] Hillard Danker [place], [place], [address], [place], [place], [place], [address], [place] Department- [place] Department- [place] Department- [phone]

## 2017-10-25 ENCOUNTER — Encounter (HOSPITAL_COMMUNITY): Payer: Self-pay | Admitting: Family Medicine

## 2017-10-25 ENCOUNTER — Emergency Department (HOSPITAL_COMMUNITY)
Admission: EM | Admit: 2017-10-25 | Discharge: 2017-10-26 | Disposition: A | Discharge status: Home / Self Care | Payer: 59 | Attending: Emergency Medicine | Admitting: Emergency Medicine

## 2017-10-25 DIAGNOSIS — Z5321 Procedure and treatment not carried out due to patient leaving prior to being seen by health care provider: Secondary | ICD-10-CM | POA: Insufficient documentation

## 2017-10-25 DIAGNOSIS — R51 Headache: Secondary | ICD-10-CM | POA: Diagnosis present

## 2017-10-25 NOTE — ED Triage Notes (Signed)
Patient is complaining of multiple complaints. She reports having URI symptoms of sore throat, ear pain, cough, left shoulder pain, and headache with dizziness.

## 2017-10-25 NOTE — ED Notes (Signed)
Pt told registration that they were leaving.  °

## 2018-02-27 ENCOUNTER — Encounter (HOSPITAL_COMMUNITY): Payer: Self-pay | Admitting: *Deleted

## 2018-02-27 ENCOUNTER — Emergency Department (HOSPITAL_COMMUNITY): Payer: 59

## 2018-02-27 DIAGNOSIS — F17210 Nicotine dependence, cigarettes, uncomplicated: Secondary | ICD-10-CM | POA: Insufficient documentation

## 2018-02-27 DIAGNOSIS — R072 Precordial pain: Secondary | ICD-10-CM | POA: Insufficient documentation

## 2018-02-27 DIAGNOSIS — R0600 Dyspnea, unspecified: Secondary | ICD-10-CM | POA: Insufficient documentation

## 2018-02-27 DIAGNOSIS — Z79899 Other long term (current) drug therapy: Secondary | ICD-10-CM | POA: Insufficient documentation

## 2018-02-27 NOTE — ED Triage Notes (Signed)
Pt stated "I felt dizzy yesterday afternoon, got up during the night and my feet hurt, I feel SOB, have lower back pain, my neck and left shoulder hurt.  My whole neck feels stiff."

## 2018-02-28 ENCOUNTER — Emergency Department (HOSPITAL_COMMUNITY)
Admission: EM | Admit: 2018-02-28 | Discharge: 2018-02-28 | Disposition: A | Discharge status: Home / Self Care | Payer: 59 | Attending: Emergency Medicine | Admitting: Emergency Medicine

## 2018-02-28 DIAGNOSIS — R0600 Dyspnea, unspecified: Secondary | ICD-10-CM

## 2018-02-28 DIAGNOSIS — R072 Precordial pain: Secondary | ICD-10-CM

## 2018-02-28 LAB — COMPREHENSIVE METABOLIC PANEL
ALT: 18 U/L (ref 14–54)
ANION GAP: 11 (ref 5–15)
AST: 16 U/L (ref 15–41)
Albumin: 3.4 g/dL — ABNORMAL LOW (ref 3.5–5.0)
Alkaline Phosphatase: 57 U/L (ref 38–126)
BUN: 15 mg/dL (ref 6–20)
CALCIUM: 8.6 mg/dL — AB (ref 8.9–10.3)
CO2: 21 mmol/L — AB (ref 22–32)
Chloride: 108 mmol/L (ref 101–111)
Creatinine, Ser: 0.65 mg/dL (ref 0.44–1.00)
GFR calc non Af Amer: >60 mL/min (ref >60)
GLUCOSE: 94 mg/dL (ref 65–99)
Potassium: 3.6 mmol/L (ref 3.5–5.1)
Sodium: 140 mmol/L (ref 135–145)
Total Bilirubin: 0.5 mg/dL (ref 0.3–1.2)
Total Protein: 6.6 g/dL (ref 6.5–8.1)

## 2018-02-28 LAB — CBC
HCT: 32.6 % — ABNORMAL LOW (ref 36.0–46.0)
HEMOGLOBIN: 10.9 g/dL — AB (ref 12.0–15.0)
MCH: 28.8 pg (ref 26.0–34.0)
MCHC: 33.4 g/dL (ref 30.0–36.0)
MCV: 86 fL (ref 78.0–100.0)
Platelets: 271 10*3/uL (ref 150–400)
RBC: 3.79 MIL/uL — AB (ref 3.87–5.11)
RDW: 14.6 % (ref 11.5–15.5)
WBC: 9.1 10*3/uL (ref 4.0–10.5)

## 2018-02-28 LAB — TROPONIN I: Troponin I: <0.03 ng/mL (ref <0.03)

## 2018-02-28 LAB — D-DIMER, QUANTITATIVE (NOT AT ARMC): D DIMER QUANT: 0.32 ug{FEU}/mL (ref 0.00–0.50)

## 2018-02-28 MED ORDER — ACETAMINOPHEN 325 MG PO TABS
650.0000 mg | ORAL_TABLET | Freq: Once | ORAL | Status: AC
  Administered 2018-02-28: 650 mg via ORAL
  Filled 2018-02-28: qty 2

## 2018-02-28 NOTE — ED Provider Notes (Signed)
[facility] Provider Note   CSN: [id number] Arrival date & time: 02/27/18  2023     History   Chief Complaint Chief Complaint  Patient presents with  . Shortness of Breath  . Back Pain  . Foot Pain    bilateral  . Gastroesophageal Reflux    HPI Tina Warren is a 37 y.o. female.  HPI Patient is a relatively healthy 37 year old female presents the emergency department with complaints of feeling somewhat lightheaded yesterday afternoon followed by development of precordial chest pain and mild shortness of breath with pain into her left neck and left shoulder.  She also reports mild low back pain.  No dysuria or urinary frequency.  She denies weakness of her arms or legs.  No difficulty with speech.  Mild headache at this time.  No fevers at home.  No history of DVT or pulmonary embolism.  No history of cardiac or pulmonary disease.  No family history of early cardiac disease.  No recent travel or surgery.  No unilateral leg swelling.   History reviewed. No pertinent past medical history.  There are no active problems to display for this patient.   Past Surgical History:  Procedure Laterality Date  . TONSILLECTOMY       OB History   None      Home Medications    Prior to Admission medications   Medication Sig Start Date End Date Taking? Authorizing Provider  Aspirin-Salicylamide-Caffeine (BC HEADACHE POWDER PO) Take 1 packet by mouth daily as needed (headache).    [provider]  benzonatate (TESSALON) 100 MG capsule Take 1 capsule (100 mg total) by mouth every 8 (eight) hours. 01/24/16   Joy, Shawn C, PA-C  ciprofloxacin (CIPRO) 500 MG tablet Take 1 tablet (500 mg total) by mouth 2 (two) times daily. One po bid x 7 days 10/31/14   Lurene Shadow, PA-C  ibuprofen (ADVIL,MOTRIN) 800 MG tablet Take 1 tablet (800 mg total) by mouth 3 (three) times daily. 01/24/16   Joy, Shawn C, PA-C  INTUNIV 1 MG TB24 Take 1 mg by mouth daily.  10/12/14   [provider]  metroNIDAZOLE (FLAGYL) 500 MG tablet Take 1 tablet (500 mg total) by mouth 2 (two) times daily. One po bid x 7 days 10/31/14   Lurene Shadow, PA-C  naproxen (NAPROSYN) 500 MG tablet Take 1 tablet (500 mg total) by mouth 2 (two) times daily. 10/31/14   Lurene Shadow, PA-C  omeprazole (PRILOSEC) 20 MG capsule Take 20 mg by mouth 2 (two) times daily as needed. 10/24/14   [provider]  ondansetron (ZOFRAN ODT) 4 MG disintegrating tablet Take 1 tablet (4 mg total) by mouth every 8 (eight) hours as needed for nausea or vomiting. 01/24/16   Joy, Shawn C, PA-C  promethazine (PHENERGAN) 25 MG tablet Take 1 tablet (25 mg total) by mouth every 6 (six) hours as needed for nausea or vomiting. 10/31/14   Lurene Shadow, PA-C  propranolol (INDERAL) 10 MG tablet Take 10 mg by mouth 3 (three) times daily as needed (anxiety).  10/12/14   [provider]  traMADol (ULTRAM) 50 MG tablet Take 1 tablet (50 mg total) by mouth every 6 (six) hours as needed. 10/31/14   Lurene Shadow, PA-C  traMADol (ULTRAM) 50 MG tablet Take 1 tablet (50 mg total) by mouth every 6 (six) hours as needed. 02/10/15   Marlon Pel, PA-C  traZODone (DESYREL) 50 MG tablet Take 50 mg by  mouth at bedtime as needed. 08/30/14   [provider]  venlafaxine XR (EFFEXOR-XR) 150 MG 24 hr capsule Take 150 mg by mouth daily. 10/29/14   [provider]    Family History No family history on file.  Social History Social History   Tobacco Use  . Smoking status: Current Every Day Smoker    Packs/day: 0.25    Types: Cigarettes  . Smokeless tobacco: Never Used  Substance Use Topics  . Alcohol use: Yes    Comment: rarely  . Drug use: No     Allergies   Penicillins   Review of Systems Review of Systems  All other systems reviewed and are negative.    Physical Exam Updated Vital Signs BP (!) 126/91 (BP Location: Left Arm)   Pulse 78   Temp 98.2 F (36.8 C)  (Oral)   Resp 18   Ht 5\' 5"  (1.651 m)   Wt 106 kg (233 lb 9.6 oz)   LMP 01/26/2018 (Approximate)   SpO2 99%   BMI 38.87 kg/m   Physical Exam  Constitutional: She is oriented to person, place, and time. She appears well-developed and well-nourished. No distress.  HENT:  Head: Normocephalic and atraumatic.  Eyes: EOM are normal.  Neck: Normal range of motion.  Cardiovascular: Normal rate, regular rhythm and normal heart sounds.  Pulmonary/Chest: Effort normal and breath sounds normal.  Abdominal: Soft. She exhibits no distension. There is no tenderness.  Musculoskeletal: Normal range of motion.  Neurological: She is alert and oriented to person, place, and time.  Skin: Skin is warm and dry.  Psychiatric: She has a normal mood and affect. Judgment normal.  Nursing note and vitals reviewed.    ED Treatments / Results  Labs (all labs ordered are listed, but only abnormal results are displayed)  Labs Reviewed  CBC - Abnormal; Notable for the following components:      Result Value   RBC 3.79 (*)    Hemoglobin 10.9 (*)    HCT 32.6 (*)    All other components within normal limits  COMPREHENSIVE METABOLIC PANEL - Abnormal; Notable for the following components:   CO2 21 (*)    Calcium 8.6 (*)    Albumin 3.4 (*)    All other components within normal limits  TROPONIN I  D-DIMER, QUANTITATIVE (NOT AT [facility])      EKG EKG Interpretation  Date/Time:  Sunday February 27 2018 20:42:40 EDT Ventricular Rate:  84 PR Interval:    QRS Duration: 76 QT Interval:  371 QTC Calculation: 439 R Axis:   66 Text Interpretation:  Sinus rhythm No old tracing to compare Confirmed by Azalia Bilisampos, Nechama Escutia ([id number]) on 02/28/2018 1:48:33 AM   Radiology Dg Chest 2 View  Result Date: 02/27/2018 CLINICAL DATA:  Shortness of breath, left chest pain, smoker EXAM: CHEST - 2 VIEW COMPARISON:  10/19/2006 FINDINGS: Heart and mediastinal contours are within normal limits. No focal opacities or effusions. No acute  bony abnormality. IMPRESSION: No active cardiopulmonary disease. Electronically Signed   By: Charlett NoseKevin  Dover M.D.   On: 02/27/2018 21:16    Procedures Procedures (including critical care time)  Medications Ordered in ED Medications  acetaminophen (TYLENOL) tablet 650 mg (650 mg Oral Given 02/28/18 0327)     Initial Impression / Assessment and Plan / ED Course  I have reviewed the triage vital signs and the nursing notes.  Pertinent labs & imaging results that were available during my care of the patient were reviewed by me and  considered in my medical decision making (see chart for details).     Considerations for atypical ACS however less likely.  Also in the differential is pneumonia versus PE versus bronchitis versus musculoskeletal pain  EKG without ischemic changes.  Pain is been constant for 6-8 hours.  Initial troponin is negative.  Given the constant and prolonged nature of her pain a single troponin is all that is needed.  D-dimer negative.  Chest x-ray without infiltrate.  Vital signs are stable.  Labs are normal including hemoglobin of 10.9 and a normal BUN and creatinine.  Atypical presentation.  Unclear etiology.  Likely musculoskeletal and/or gastroesophageal reflux disease.  Otherwise well-appearing and stable for discharge.  I do not think she needs additional testing or admission to the hospital this time.  Close primary care follow-up.  Patient understands return to the emergency department for new or worsening symptoms  Final Clinical Impressions(s) / ED Diagnoses   Final diagnoses:  Precordial chest pain  Dyspnea, unspecified type    ED Discharge Orders    None       Azalia Bilis, MD 02/28/18 [phone]

## 2018-05-14 ENCOUNTER — Other Ambulatory Visit: Payer: Self-pay

## 2018-05-14 ENCOUNTER — Emergency Department (HOSPITAL_COMMUNITY)
Admission: EM | Admit: 2018-05-14 | Discharge: 2018-05-15 | Disposition: A | Discharge status: Home / Self Care | Payer: 59 | Attending: Emergency Medicine | Admitting: Emergency Medicine

## 2018-05-14 ENCOUNTER — Encounter (HOSPITAL_COMMUNITY): Payer: Self-pay | Admitting: *Deleted

## 2018-05-14 DIAGNOSIS — R519 Headache, unspecified: Secondary | ICD-10-CM

## 2018-05-14 DIAGNOSIS — Z79899 Other long term (current) drug therapy: Secondary | ICD-10-CM | POA: Insufficient documentation

## 2018-05-14 DIAGNOSIS — R51 Headache: Secondary | ICD-10-CM | POA: Insufficient documentation

## 2018-05-14 DIAGNOSIS — F17210 Nicotine dependence, cigarettes, uncomplicated: Secondary | ICD-10-CM | POA: Insufficient documentation

## 2018-05-14 NOTE — ED Triage Notes (Signed)
Pt crying & stated "I lost my job today and I really don't have a place to go.  I can't stay with my daughter because she's a student @ [employer]."  Pt denies SI/HI.  C/o h/a & nausea.

## 2018-05-15 MED ORDER — DIPHENHYDRAMINE HCL 50 MG/ML IJ SOLN
25.0000 mg | Freq: Once | INTRAMUSCULAR | Status: AC
  Administered 2018-05-15: 25 mg via INTRAMUSCULAR
  Filled 2018-05-15: qty 1

## 2018-05-15 MED ORDER — PROCHLORPERAZINE EDISYLATE 10 MG/2ML IJ SOLN
10.0000 mg | Freq: Once | INTRAMUSCULAR | Status: AC
  Administered 2018-05-15: 10 mg via INTRAMUSCULAR
  Filled 2018-05-15: qty 2

## 2018-05-15 MED ORDER — DEXAMETHASONE 2 MG PO TABS
10.0000 mg | ORAL_TABLET | Freq: Once | ORAL | Status: AC
  Administered 2018-05-15: 10 mg via ORAL
  Filled 2018-05-15: qty 2

## 2018-05-15 NOTE — ED Provider Notes (Signed)
[facility]-EMERGENCY DEPT Provider Note   CSN: [id number] Arrival date & time: 05/14/18  2134     History   Chief Complaint Chief Complaint  Patient presents with  . Headache    HPI Tina Warren is a 37 y.o. female.  37 yo F with a chief complaint of a headache.  This started this morning was slow in onset and gradually worsened throughout the day.  The patient unfortunately has been under some stress at home she was recently fired from her job.  Describes the headache as dull and throbbing.  Worse with bright lights.  Denies fevers or cough congestion.  Denies trauma.  Denies neck pain.  Denies unilateral numbness or weakness denies difficulty with speech or swallowing.  The history is provided by the patient.  Headache   This is a recurrent problem. The current episode started yesterday. The problem occurs constantly. The problem has not changed since onset.The headache is associated with nothing. The pain is at a severity of 7/10. The pain is mild. The pain does not radiate. Pertinent negatives include no fever, no palpitations, no shortness of breath, no nausea and no vomiting. She has tried nothing for the symptoms. The treatment provided no relief.    History reviewed. No pertinent past medical history.  There are no active problems to display for this patient.   Past Surgical History:  Procedure Laterality Date  . TONSILLECTOMY       OB History   None      Home Medications    Prior to Admission medications   Medication Sig Start Date End Date Taking? Authorizing Provider  Aspirin-Salicylamide-Caffeine (BC HEADACHE POWDER PO) Take 1 packet by mouth daily as needed (headache).    [provider]  benzonatate (TESSALON) 100 MG capsule Take 1 capsule (100 mg total) by mouth every 8 (eight) hours. 01/24/16   Joy, Shawn C, PA-C  ciprofloxacin (CIPRO) 500 MG tablet Take 1 tablet (500 mg total) by mouth 2 (two) times daily. One po bid x 7  days 10/31/14   Lurene Shadow, PA-C  ibuprofen (ADVIL,MOTRIN) 800 MG tablet Take 1 tablet (800 mg total) by mouth 3 (three) times daily. 01/24/16   Joy, Shawn C, PA-C  INTUNIV 1 MG TB24 Take 1 mg by mouth daily. 10/12/14   [provider]  metroNIDAZOLE (FLAGYL) 500 MG tablet Take 1 tablet (500 mg total) by mouth 2 (two) times daily. One po bid x 7 days 10/31/14   Lurene Shadow, PA-C  naproxen (NAPROSYN) 500 MG tablet Take 1 tablet (500 mg total) by mouth 2 (two) times daily. 10/31/14   Lurene Shadow, PA-C  omeprazole (PRILOSEC) 20 MG capsule Take 20 mg by mouth 2 (two) times daily as needed. 10/24/14   [provider]  ondansetron (ZOFRAN ODT) 4 MG disintegrating tablet Take 1 tablet (4 mg total) by mouth every 8 (eight) hours as needed for nausea or vomiting. 01/24/16   Joy, Shawn C, PA-C  promethazine (PHENERGAN) 25 MG tablet Take 1 tablet (25 mg total) by mouth every 6 (six) hours as needed for nausea or vomiting. 10/31/14   Lurene Shadow, PA-C  propranolol (INDERAL) 10 MG tablet Take 10 mg by mouth 3 (three) times daily as needed (anxiety).  10/12/14   [provider]  traMADol (ULTRAM) 50 MG tablet Take 1 tablet (50 mg total) by mouth every 6 (six) hours as needed. 10/31/14   Lurene Shadow, PA-C  traMADol Janean Sark) 50  MG tablet Take 1 tablet (50 mg total) by mouth every 6 (six) hours as needed. 02/10/15   Marlon PelGreene, Tiffany, PA-C  traZODone (DESYREL) 50 MG tablet Take 50 mg by mouth at bedtime as needed. 08/30/14   [provider]  venlafaxine XR (EFFEXOR-XR) 150 MG 24 hr capsule Take 150 mg by mouth daily. 10/29/14   [provider]    Family History No family history on file.  Social History Social History   Tobacco Use  . Smoking status: Current Every Day Smoker    Packs/day: 0.25    Types: Cigarettes  . Smokeless tobacco: Never Used  Substance Use Topics  . Alcohol use: Yes    Comment: rarely  . Drug use: No     Allergies     Penicillins   Review of Systems Review of Systems  Constitutional: Negative for chills and fever.  HENT: Negative for congestion and rhinorrhea.   Eyes: Negative for redness and visual disturbance.  Respiratory: Negative for shortness of breath and wheezing.   Cardiovascular: Negative for chest pain and palpitations.  Gastrointestinal: Negative for nausea and vomiting.  Genitourinary: Negative for dysuria and urgency.  Musculoskeletal: Negative for arthralgias and myalgias.  Skin: Negative for pallor and wound.  Neurological: Positive for headaches. Negative for dizziness.     Physical Exam Updated Vital Signs BP (!) 104/51   Pulse 85   Temp 98.4 F (36.9 C) (Oral)   Resp 16   Ht 5\' 5"  (1.651 m)   Wt 103.8 kg (228 lb 14.4 oz)   LMP 05/06/2018 (Approximate)   SpO2 98%   BMI 38.09 kg/m   Physical Exam  Constitutional: She is oriented to person, place, and time. She appears well-developed and well-nourished. No distress.  HENT:  Head: Normocephalic and atraumatic.  Eyes: Pupils are equal, round, and reactive to light. EOM are normal.  Neck: Normal range of motion. Neck supple.  Cardiovascular: Normal rate and regular rhythm. Exam reveals no gallop and no friction rub.  No murmur heard. Pulmonary/Chest: Effort normal. She has no wheezes. She has no rales.  Abdominal: Soft. She exhibits no distension. There is no tenderness.  Musculoskeletal: She exhibits no edema or tenderness.  Neurological: She is alert and oriented to person, place, and time. She has normal strength. No cranial nerve deficit or sensory deficit. She displays a negative Romberg sign. Coordination and gait normal.  Benign neuro exam  Skin: Skin is warm and dry. She is not diaphoretic.  Psychiatric: She has a normal mood and affect. Her behavior is normal.  Nursing note and vitals reviewed.    ED Treatments / Results  Labs (all labs ordered are listed, but only abnormal results are displayed) Labs  Reviewed - No data to display  EKG None  Radiology No results found.  Procedures Procedures (including critical care time)  Medications Ordered in ED Medications  prochlorperazine (COMPAZINE) injection 10 mg (10 mg Intramuscular Given 05/15/18 0028)  diphenhydrAMINE (BENADRYL) injection 25 mg (25 mg Intramuscular Given 05/15/18 0028)  dexamethasone (DECADRON) tablet 10 mg (10 mg Oral Given 05/15/18 0026)     Initial Impression / Assessment and Plan / ED Course  I have reviewed the triage vital signs and the nursing notes.  Pertinent labs & imaging results that were available during my care of the patient were reviewed by me and considered in my medical decision making (see chart for details).     37 yo F with a cc of a headache.  No red  flags.  Benign neuro exam.  Will give a headache cocktail and reassess.  Headache is feeling better.  Discharge home.  2:22 AM:  I have discussed the diagnosis/risks/treatment options with the patient and believe the pt to be eligible for discharge home to follow-up with PCP. We also discussed returning to the ED immediately if new or worsening sx occur. We discussed the sx which are most concerning (e.g., sudden worsening pain, fever, inability to tolerate by mouth) that necessitate immediate return. Medications administered to the patient during their visit and any new prescriptions provided to the patient are listed below.  Medications given during this visit Medications  prochlorperazine (COMPAZINE) injection 10 mg (10 mg Intramuscular Given 05/15/18 0028)  diphenhydrAMINE (BENADRYL) injection 25 mg (25 mg Intramuscular Given 05/15/18 0028)  dexamethasone (DECADRON) tablet 10 mg (10 mg Oral Given 05/15/18 0026)      The patient appears reasonably screen and/or stabilized for discharge and I doubt any other medical condition or other [facility] requiring further screening, evaluation, or treatment in the ED at this time prior to discharge.    Final  Clinical Impressions(s) / ED Diagnoses   Final diagnoses:  Bad headache    ED Discharge Orders        Ordered    Ambulatory referral to Neurology    Comments:  Headache syndrome   05/15/18 0129       Melene Plan, DO 05/15/18 0222

## 2018-06-15 ENCOUNTER — Emergency Department (HOSPITAL_COMMUNITY)
Admission: EM | Admit: 2018-06-15 | Discharge: 2018-06-15 | Disposition: A | Discharge status: Home / Self Care | Payer: 59 | Attending: Emergency Medicine | Admitting: Emergency Medicine

## 2018-06-15 ENCOUNTER — Encounter (HOSPITAL_COMMUNITY): Payer: Self-pay | Admitting: Emergency Medicine

## 2018-06-15 ENCOUNTER — Other Ambulatory Visit: Payer: Self-pay

## 2018-06-15 DIAGNOSIS — F17210 Nicotine dependence, cigarettes, uncomplicated: Secondary | ICD-10-CM | POA: Diagnosis not present

## 2018-06-15 DIAGNOSIS — Z79899 Other long term (current) drug therapy: Secondary | ICD-10-CM | POA: Insufficient documentation

## 2018-06-15 DIAGNOSIS — R21 Rash and other nonspecific skin eruption: Secondary | ICD-10-CM

## 2018-06-15 LAB — CBC WITH DIFFERENTIAL/PLATELET
Basophils Absolute: 0 10*3/uL (ref 0.0–0.1)
Basophils Relative: 1 %
Eosinophils Absolute: 0.2 10*3/uL (ref 0.0–0.7)
Eosinophils Relative: 2 %
HCT: 35.1 % — ABNORMAL LOW (ref 36.0–46.0)
HEMOGLOBIN: 11.9 g/dL — AB (ref 12.0–15.0)
Lymphocytes Relative: 28 %
Lymphs Abs: 2.4 10*3/uL (ref 0.7–4.0)
MCH: 29 pg (ref 26.0–34.0)
MCHC: 33.9 g/dL (ref 30.0–36.0)
MCV: 85.4 fL (ref 78.0–100.0)
MONOS PCT: 4 %
Monocytes Absolute: 0.4 10*3/uL (ref 0.1–1.0)
NEUTROS ABS: 5.7 10*3/uL (ref 1.7–7.7)
NEUTROS PCT: 65 %
Platelets: 339 10*3/uL (ref 150–400)
RBC: 4.11 MIL/uL (ref 3.87–5.11)
RDW: 14.4 % (ref 11.5–15.5)
WBC: 8.7 10*3/uL (ref 4.0–10.5)

## 2018-06-15 MED ORDER — TRIAMCINOLONE ACETONIDE 0.1 % EX CREA: 1.0000 "application " | TOPICAL_CREAM | Freq: Two times a day (BID) | CUTANEOUS | 0 refills | Status: DC

## 2018-06-15 NOTE — ED Triage Notes (Signed)
Patient c/o painful rash to right buttock x2 days. Reports hx shingles. States rash recurs during times of stress and remains on only the right side.

## 2018-06-15 NOTE — ED Provider Notes (Signed)
[facility]-EMERGENCY DEPT Provider Note  CSN: [id number] Arrival date & time: 06/15/18  1709  History   Chief Complaint Chief Complaint  Patient presents with  . Rash    HPI Tina Warren is a 37 y.o. female with no significant medical history who presented to the ED for rash x2 days. Patient describes a recurrent blister-like rash on her buttock and low back area that appears mostly with stress. She states that it is itchy and burns. Patient states that the area resolves on its own after a few days. During this outbreak, she has tried nothing prior to coming to the ED. Denies fever, chills, arthralgias or other skin rashes/lesions. Denies recent sick contacts, new allergens, exposures, recent travel or animal/insect bites. Patient reports being treated for shingles in the past ~ 10 years ago and was prescribed Famvir in 2015 for the rash.  History reviewed. No pertinent past medical history.  There are no active problems to display for this patient.   Past Surgical History:  Procedure Laterality Date  . TONSILLECTOMY       OB History   None      Home Medications    Prior to Admission medications   Medication Sig Start Date End Date Taking? Authorizing Provider  Aspirin-Caffeine (BC FAST PAIN RELIEF PO) Take 1 packet by mouth daily as needed (pain).   Yes [provider]  venlafaxine XR (EFFEXOR-XR) 150 MG 24 hr capsule Take 150 mg by mouth daily. 10/29/14  Yes [provider]  benzonatate (TESSALON) 100 MG capsule Take 1 capsule (100 mg total) by mouth every 8 (eight) hours. Patient not taking: Reported on 06/15/2018 01/24/16   Anselm Pancoast, PA-C  ciprofloxacin (CIPRO) 500 MG tablet Take 1 tablet (500 mg total) by mouth 2 (two) times daily. One po bid x 7 days Patient not taking: Reported on 06/15/2018 10/31/14   Lurene Shadow, PA-C  ibuprofen (ADVIL,MOTRIN) 800 MG tablet Take 1 tablet (800 mg total) by mouth 3 (three) times daily. Patient  not taking: Reported on 06/15/2018 01/24/16   Joy, Ines Bloomer C, PA-C  metroNIDAZOLE (FLAGYL) 500 MG tablet Take 1 tablet (500 mg total) by mouth 2 (two) times daily. One po bid x 7 days Patient not taking: Reported on 06/15/2018 10/31/14   Lurene Shadow, PA-C  naproxen (NAPROSYN) 500 MG tablet Take 1 tablet (500 mg total) by mouth 2 (two) times daily. Patient not taking: Reported on 06/15/2018 10/31/14   Lurene Shadow, PA-C  ondansetron (ZOFRAN ODT) 4 MG disintegrating tablet Take 1 tablet (4 mg total) by mouth every 8 (eight) hours as needed for nausea or vomiting. Patient not taking: Reported on 06/15/2018 01/24/16   Anselm Pancoast, PA-C  promethazine (PHENERGAN) 25 MG tablet Take 1 tablet (25 mg total) by mouth every 6 (six) hours as needed for nausea or vomiting. Patient not taking: Reported on 06/15/2018 10/31/14   Lurene Shadow, PA-C  traMADol (ULTRAM) 50 MG tablet Take 1 tablet (50 mg total) by mouth every 6 (six) hours as needed. Patient not taking: Reported on 06/15/2018 10/31/14   Lurene Shadow, PA-C  traMADol (ULTRAM) 50 MG tablet Take 1 tablet (50 mg total) by mouth every 6 (six) hours as needed. Patient not taking: Reported on 06/15/2018 02/10/15   Marlon Pel, PA-C  triamcinolone cream (KENALOG) 0.1 % Apply 1 application topically 2 (two) times daily. 06/15/18   Gemini Bunte, Sharyon Medicus, PA-C    Family History No family history on  file.  Social History Social History   Tobacco Use  . Smoking status: Current Every Day Smoker    Packs/day: 0.25    Types: Cigarettes  . Smokeless tobacco: Never Used  Substance Use Topics  . Alcohol use: Yes    Comment: rarely  . Drug use: No     Allergies   Codeine and Penicillins   Review of Systems Review of Systems  Constitutional: Negative for chills, fatigue and fever.  Gastrointestinal: Negative.   Genitourinary: Negative.   Musculoskeletal: Negative.   Skin: Positive for rash. Negative for wound.  Hematological: Negative.      Physical  Exam Updated Vital Signs BP (!) 137/91 (BP Location: Left Arm)   Pulse 79   Temp 99.7 F (37.6 C) (Oral)   Resp 16   Ht 5\' 5"  (1.651 m)   Wt 102.3 kg (225 lb 9 oz)   LMP 06/07/2018   SpO2 99%   BMI 37.54 kg/m   Physical Exam  Constitutional: Vital signs are normal. She appears well-developed and well-nourished. She is cooperative. No distress.  HENT:  Head: Normocephalic and atraumatic.  Hair and scalp normal.  Neurological: She is alert.  Skin: Skin is warm and intact. Rash noted. Rash is pustular.  3cm patch of pustules on right buttock. No surrounding erythema. Not actively draining or bleeding. No ulcerations, desquamation or areas in different phases. No rashes or lesions on the rest of body or scalp.  Nursing note and vitals reviewed.    ED Treatments / Results  Labs (all labs ordered are listed, but only abnormal results are displayed) Labs Reviewed  CBC WITH DIFFERENTIAL/PLATELET - Abnormal; Notable for the following components:      Result Value   Hemoglobin 11.9 (*)    HCT 35.1 (*)    All other components within normal limits    EKG None  Radiology No results found.  Procedures Procedures (including critical care time)  Medications Ordered in ED Medications - No data to display   Initial Impression / Assessment and Plan / ED Course  Triage vital signs and the nursing notes have been reviewed.  Pertinent labs & imaging results that were available during care of the patient were reviewed and considered in medical decision making (see chart for details).  Patient presents with recurring skin rash. There are no concerns that this is SJS/TEN or necrotizing condition. Patient is well appearing and only has rash in one area. Patient has no accompanying s/s such as fever, chills, arthralgias or GU complaints that would suggest an underlying infectious or rheumatologic etiology. Pattern is not consistent with viral exanthem, eczema, psoriasis or shingles.  Given the area of contact, it is possible that this is contact dermatitis.   Clinical Course as of Jun 15 1930  Wed Jun 15, 2018  1920 Labs unremarkable.   [GM]    Clinical Course User Index [GM] Ebonie Westerlund, Sharyon MedicusGabrielle I, PA-C    Final Clinical Impressions(s) / ED Diagnoses  1. Rash. Possible contact dermatitis. Kenalog cream prescribed for treatment. Advised to follow-up with PCP.  Dispo: Home. After thorough clinical evaluation, this patient is determined to be medically stable and can be safely discharged with the previously mentioned treatment and/or outpatient follow-up/referral(s). At this time, there are no other apparent medical conditions that require further screening, evaluation or treatment.   Final diagnoses:  Rash    ED Discharge Orders        Ordered    triamcinolone cream (KENALOG) 0.1 %  2  times daily     06/15/18 [address] 06/15/18 1931    Little, Ambrose Finland, MD 06/16/18 [phone]

## 2018-06-15 NOTE — Discharge Instructions (Signed)
I have prescribed you a steroid cream to help with this rash. Low likelihood that this is a shingles outbreak as shingles does not usually recur. Please follow-up with your PCP to discuss this further if you continue to find this bothersome.

## 2018-08-02 ENCOUNTER — Ambulatory Visit (HOSPITAL_COMMUNITY): Admission: EM | Admit: 2018-08-02 | Discharge: 2018-08-02 | Discharge status: Absent without leave | Payer: 59

## 2018-08-07 ENCOUNTER — Emergency Department (HOSPITAL_COMMUNITY): Payer: 59

## 2018-08-07 ENCOUNTER — Encounter (HOSPITAL_COMMUNITY): Payer: Self-pay

## 2018-08-07 ENCOUNTER — Emergency Department (HOSPITAL_COMMUNITY)
Admission: EM | Admit: 2018-08-07 | Discharge: 2018-08-07 | Disposition: A | Discharge status: Home / Self Care | Payer: 59 | Attending: Emergency Medicine | Admitting: Emergency Medicine

## 2018-08-07 DIAGNOSIS — Z79899 Other long term (current) drug therapy: Secondary | ICD-10-CM | POA: Insufficient documentation

## 2018-08-07 DIAGNOSIS — R0602 Shortness of breath: Secondary | ICD-10-CM | POA: Insufficient documentation

## 2018-08-07 DIAGNOSIS — K0889 Other specified disorders of teeth and supporting structures: Secondary | ICD-10-CM | POA: Insufficient documentation

## 2018-08-07 DIAGNOSIS — J029 Acute pharyngitis, unspecified: Secondary | ICD-10-CM | POA: Diagnosis present

## 2018-08-07 DIAGNOSIS — F17210 Nicotine dependence, cigarettes, uncomplicated: Secondary | ICD-10-CM | POA: Insufficient documentation

## 2018-08-07 DIAGNOSIS — R05 Cough: Secondary | ICD-10-CM | POA: Insufficient documentation

## 2018-08-07 DIAGNOSIS — R059 Cough, unspecified: Secondary | ICD-10-CM

## 2018-08-07 DIAGNOSIS — R0981 Nasal congestion: Secondary | ICD-10-CM | POA: Insufficient documentation

## 2018-08-07 LAB — GROUP A STREP BY PCR: Group A Strep by PCR: NOT DETECTED

## 2018-08-07 MED ORDER — CETIRIZINE HCL 10 MG PO TABS: 10.0000 mg | ORAL_TABLET | Freq: Every day | ORAL | 0 refills | Status: DC

## 2018-08-07 MED ORDER — CLINDAMYCIN HCL 150 MG PO CAPS
450.0000 mg | ORAL_CAPSULE | Freq: Three times a day (TID) | ORAL | 0 refills | Status: DC
Start: 2018-08-07 — End: 2020-03-08

## 2018-08-07 MED ORDER — KETOROLAC TROMETHAMINE 30 MG/ML IJ SOLN
30.0000 mg | Freq: Once | INTRAMUSCULAR | Status: AC
  Administered 2018-08-07: 30 mg via INTRAMUSCULAR
  Filled 2018-08-07: qty 1

## 2018-08-07 MED ORDER — CLINDAMYCIN HCL 150 MG PO CAPS
450.0000 mg | ORAL_CAPSULE | Freq: Once | ORAL | Status: AC
  Administered 2018-08-07: 450 mg via ORAL
  Filled 2018-08-07: qty 3

## 2018-08-07 MED ORDER — KETOROLAC TROMETHAMINE 10 MG PO TABS: 10.0000 mg | ORAL_TABLET | Freq: Four times a day (QID) | ORAL | 0 refills | Status: DC | PRN

## 2018-08-07 MED ORDER — BENZONATATE 100 MG PO CAPS: 100.0000 mg | ORAL_CAPSULE | Freq: Three times a day (TID) | ORAL | 0 refills | Status: DC

## 2018-08-07 MED ORDER — ACETAMINOPHEN 500 MG PO TABS: 500.0000 mg | ORAL_TABLET | Freq: Four times a day (QID) | ORAL | 0 refills | Status: DC | PRN

## 2018-08-07 NOTE — Discharge Instructions (Addendum)
Take Toradol every 6 hours as needed for pain.  Make sure to take with food to prevent GI bleed or upset.  You can alternate with this medication with Tylenol.  Take Tessalon every 8 hours as needed for cough.  Take clindamycin 3 times daily with a probiotic daily until completed.  Please follow-up with your dentist as soon as possible for further management of your dental pain.  Please return to emergency department if you develop any new or worsening symptoms.

## 2018-08-07 NOTE — ED Triage Notes (Signed)
Pt presents for evaluation of sore throat, generalized malaise and cough with "rattle" in chest. States she was at dentist a few days ago and prescribed amoxicillin. Reports allergy to penicillins so d/c use after 2-3 days because she got "sick." No rashes, hives, or difficulty breathing.

## 2018-08-07 NOTE — ED Provider Notes (Signed)
[facility] EMERGENCY DEPARTMENT Provider Note   CSN: [id number] Arrival date & time: 08/07/18  1119     History   Chief Complaint Chief Complaint  Patient presents with  . Sore Throat  . Cough    HPI Tina Warren is a 37 y.o. female who is previously healthy who presents with a one-week history of cough and sore throat.  Patient reports she started off with nasal congestion.  She also reports left lower dental pain where a filling fell out.  She was seen by her dentist who prescribed amoxicillin prior to any dental work, however patient is allergic to penicillin and only took 3 doses before she began having an itchy throat, so she stopped taking it.  That has resolved.  She denies any swelling of her lips, tongue, throat.  She has had some shortness of breath on exertion related to her cough.  She denies any chest pain, abdominal pain, nausea, vomiting.  She has taken ibuprofen and Tylenol at home for her dental pain.  HPI  History reviewed. No pertinent past medical history.  There are no active problems to display for this patient.   Past Surgical History:  Procedure Laterality Date  . TONSILLECTOMY       OB History   None      Home Medications    Prior to Admission medications   Medication Sig Start Date End Date Taking? Authorizing Provider  acetaminophen (TYLENOL) 500 MG tablet Take 1 tablet (500 mg total) by mouth every 6 (six) hours as needed. 08/07/18   Kellsie Grindle, Waylan Boga, PA-C  Aspirin-Caffeine (BC FAST PAIN RELIEF PO) Take 1 packet by mouth daily as needed (pain).    [provider]  benzonatate (TESSALON) 100 MG capsule Take 1 capsule (100 mg total) by mouth every 8 (eight) hours. 08/07/18   Abrial Arrighi, Waylan Boga, PA-C  cetirizine (ZYRTEC ALLERGY) 10 MG tablet Take 1 tablet (10 mg total) by mouth daily. 08/07/18   Bodie Abernethy, Waylan Boga, PA-C  clindamycin (CLEOCIN) 150 MG capsule Take 3 capsules (450 mg total) by mouth 3 (three) times daily.  08/07/18   Ritchard Paragas, Waylan Boga, PA-C  ketorolac (TORADOL) 10 MG tablet Take 1 tablet (10 mg total) by mouth every 6 (six) hours as needed. MAKE SURE TO TAKE WITH FOOD. 08/07/18   Emi Holes, PA-C  triamcinolone cream (KENALOG) 0.1 % Apply 1 application topically 2 (two) times daily. 06/15/18   Mortis, Jerrel Ivory I, PA-C  venlafaxine XR (EFFEXOR-XR) 150 MG 24 hr capsule Take 150 mg by mouth daily. 10/29/14   [provider]    Family History No family history on file.  Social History Social History   Tobacco Use  . Smoking status: Current Every Day Smoker    Packs/day: 0.25    Types: Cigarettes  . Smokeless tobacco: Never Used  Substance Use Topics  . Alcohol use: Yes    Comment: rarely  . Drug use: No     Allergies   Codeine and Penicillins   Review of Systems Review of Systems  Constitutional: Negative for fever.  HENT: Positive for dental problem and sore throat.   Respiratory: Positive for cough and shortness of breath (exertion).   Cardiovascular: Negative for chest pain.  Gastrointestinal: Negative for abdominal pain, nausea and vomiting.     Physical Exam Updated Vital Signs BP 134/79   Pulse 77   Temp 98.9 F (37.2 C) (Oral)   Resp 20   LMP 07/06/2018 (Approximate)  SpO2 100%   Physical Exam  Constitutional: She appears well-developed and well-nourished. No distress.  HENT:  Head: Normocephalic and atraumatic.  Right Ear: Tympanic membrane normal.  Left Ear: Tympanic membrane normal.  Mouth/Throat: Oropharynx is clear and moist. No trismus in the jaw. No oropharyngeal exudate, posterior oropharyngeal edema or posterior oropharyngeal erythema. Tonsils are 0 on the right. Tonsils are 0 on the left. No tonsillar exudate.    No submandibular tenderness or masses  Eyes: Pupils are equal, round, and reactive to light. Conjunctivae are normal. Right eye exhibits no discharge. Left eye exhibits no discharge. No scleral icterus.  Neck: Normal range of  motion. Neck supple. No thyromegaly present.  Cardiovascular: Normal rate, regular rhythm, normal heart sounds and intact distal pulses. Exam reveals no gallop and no friction rub.  No murmur heard. Pulmonary/Chest: Effort normal and breath sounds normal. No stridor. No respiratory distress. She has no wheezes. She has no rales.  Abdominal: Soft. Bowel sounds are normal. She exhibits no distension. There is no tenderness. There is no rebound and no guarding.  Musculoskeletal: She exhibits no edema.  Lymphadenopathy:    She has no cervical adenopathy.  Neurological: She is alert. Coordination normal.  Skin: Skin is warm and dry. No rash noted. She is not diaphoretic. No pallor.  Psychiatric: She has a normal mood and affect.  Nursing note and vitals reviewed.    ED Treatments / Results  Labs (all labs ordered are listed, but only abnormal results are displayed) Labs Reviewed  GROUP A STREP BY PCR    EKG None  Radiology Dg Chest 2 View  Result Date: 08/07/2018 CLINICAL DATA:  Cough and short of breath EXAM: CHEST - 2 VIEW COMPARISON:  02/27/2018 FINDINGS: Normal mediastinum and cardiac silhouette. Normal pulmonary vasculature. No evidence of effusion, infiltrate, or pneumothorax. No acute bony abnormality. IMPRESSION: Normal chest radiograph. Electronically Signed   By: Genevive Bi M.D.   On: 08/07/2018 13:16    Procedures Procedures (including critical care time)  Medications Ordered in ED Medications  ketorolac (TORADOL) 30 MG/ML injection 30 mg (30 mg Intramuscular Given 08/07/18 1318)  clindamycin (CLEOCIN) capsule 450 mg (450 mg Oral Given 08/07/18 1317)     Initial Impression / Assessment and Plan / ED Course  I have reviewed the triage vital signs and the nursing notes.  Pertinent labs & imaging results that were available during my care of the patient were reviewed by me and considered in my medical decision making (see chart for details).     Patient with URI  as well as dental pain.  Chest x-ray is negative.  Strep PCR is negative.  Patient feeling much improved with IM Toradol in the ED.  We will treat cough and nasal congestion supportively with Tessalon and Zyrtec.  Will treat dental pain with Tylenol and Toradol.  I stressed the importance of making sure to take Toradol with food and not to combine with ibuprofen.  Will treat with clindamycin, as patient allergic to amoxicillin prescribed by dentist.  Advised to take with probiotic.  Patient will follow-up to dentist this week.  Return precautions discussed.  Patient understands and agrees with plan.  Patient vitals stable throughout ED course and discharged in satisfactory condition.  Final Clinical Impressions(s) / ED Diagnoses   Final diagnoses:  Cough  Pharyngitis, unspecified etiology  Pain, dental    ED Discharge Orders         Ordered    clindamycin (CLEOCIN) 150 MG capsule  3 times daily     08/07/18 1350    acetaminophen (TYLENOL) 500 MG tablet  Every 6 hours PRN     08/07/18 1350    benzonatate (TESSALON) 100 MG capsule  Every 8 hours     08/07/18 1350    ketorolac (TORADOL) 10 MG tablet  Every 6 hours PRN     08/07/18 1350    cetirizine (ZYRTEC ALLERGY) 10 MG tablet  Daily     08/07/18 1352           Emi Holes, PA-C 08/07/18 1354    Loren Racer, MD 08/07/18 1552

## 2018-12-15 IMAGING — CR DG CHEST 2V
2 series · 2 of 2 positions shown · non-contrast
Comparison: 10/19/2006

CLINICAL DATA: Shortness of breath, left chest pain, smoker

EXAM:
CHEST - 2 VIEW

[w chest pa]
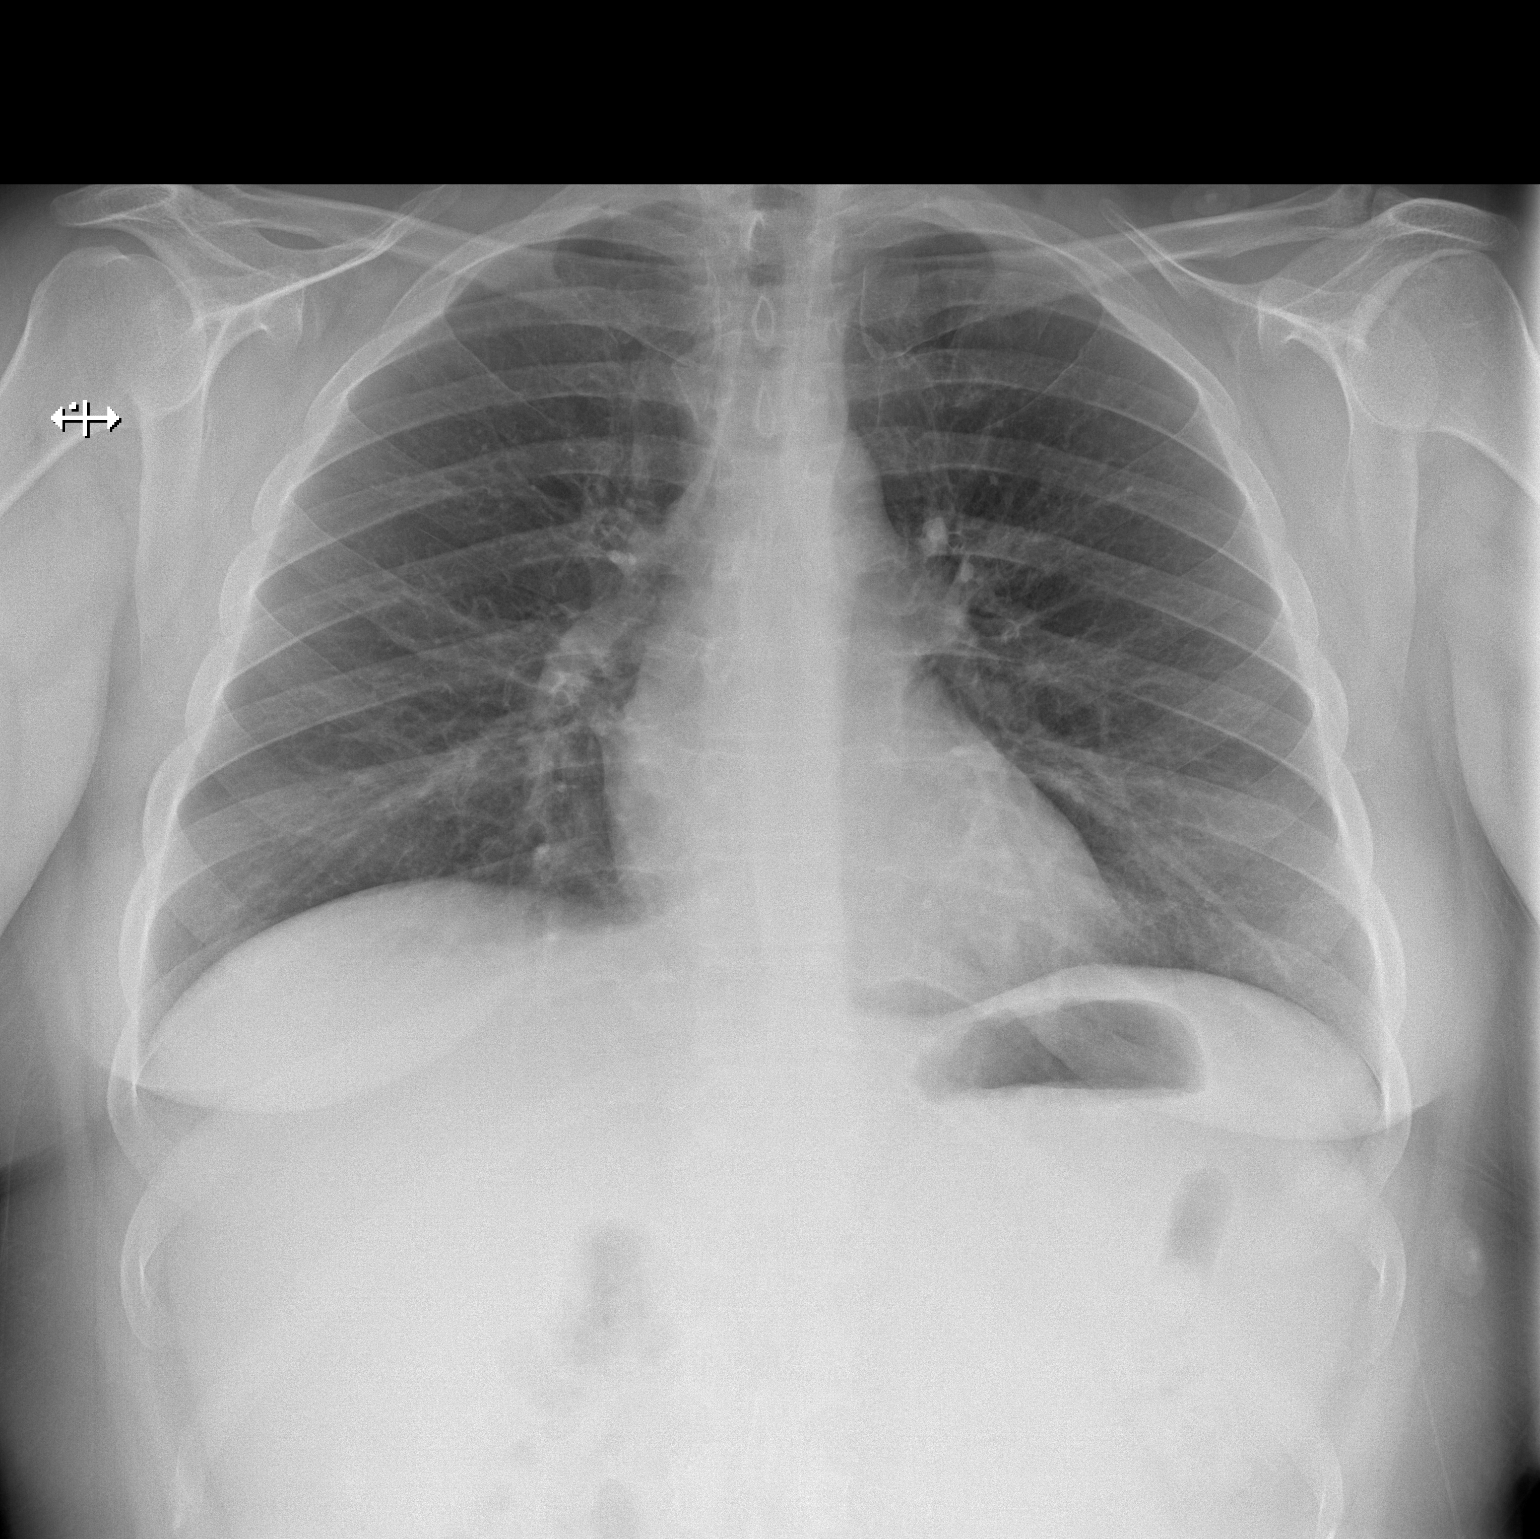

[w chest lat]
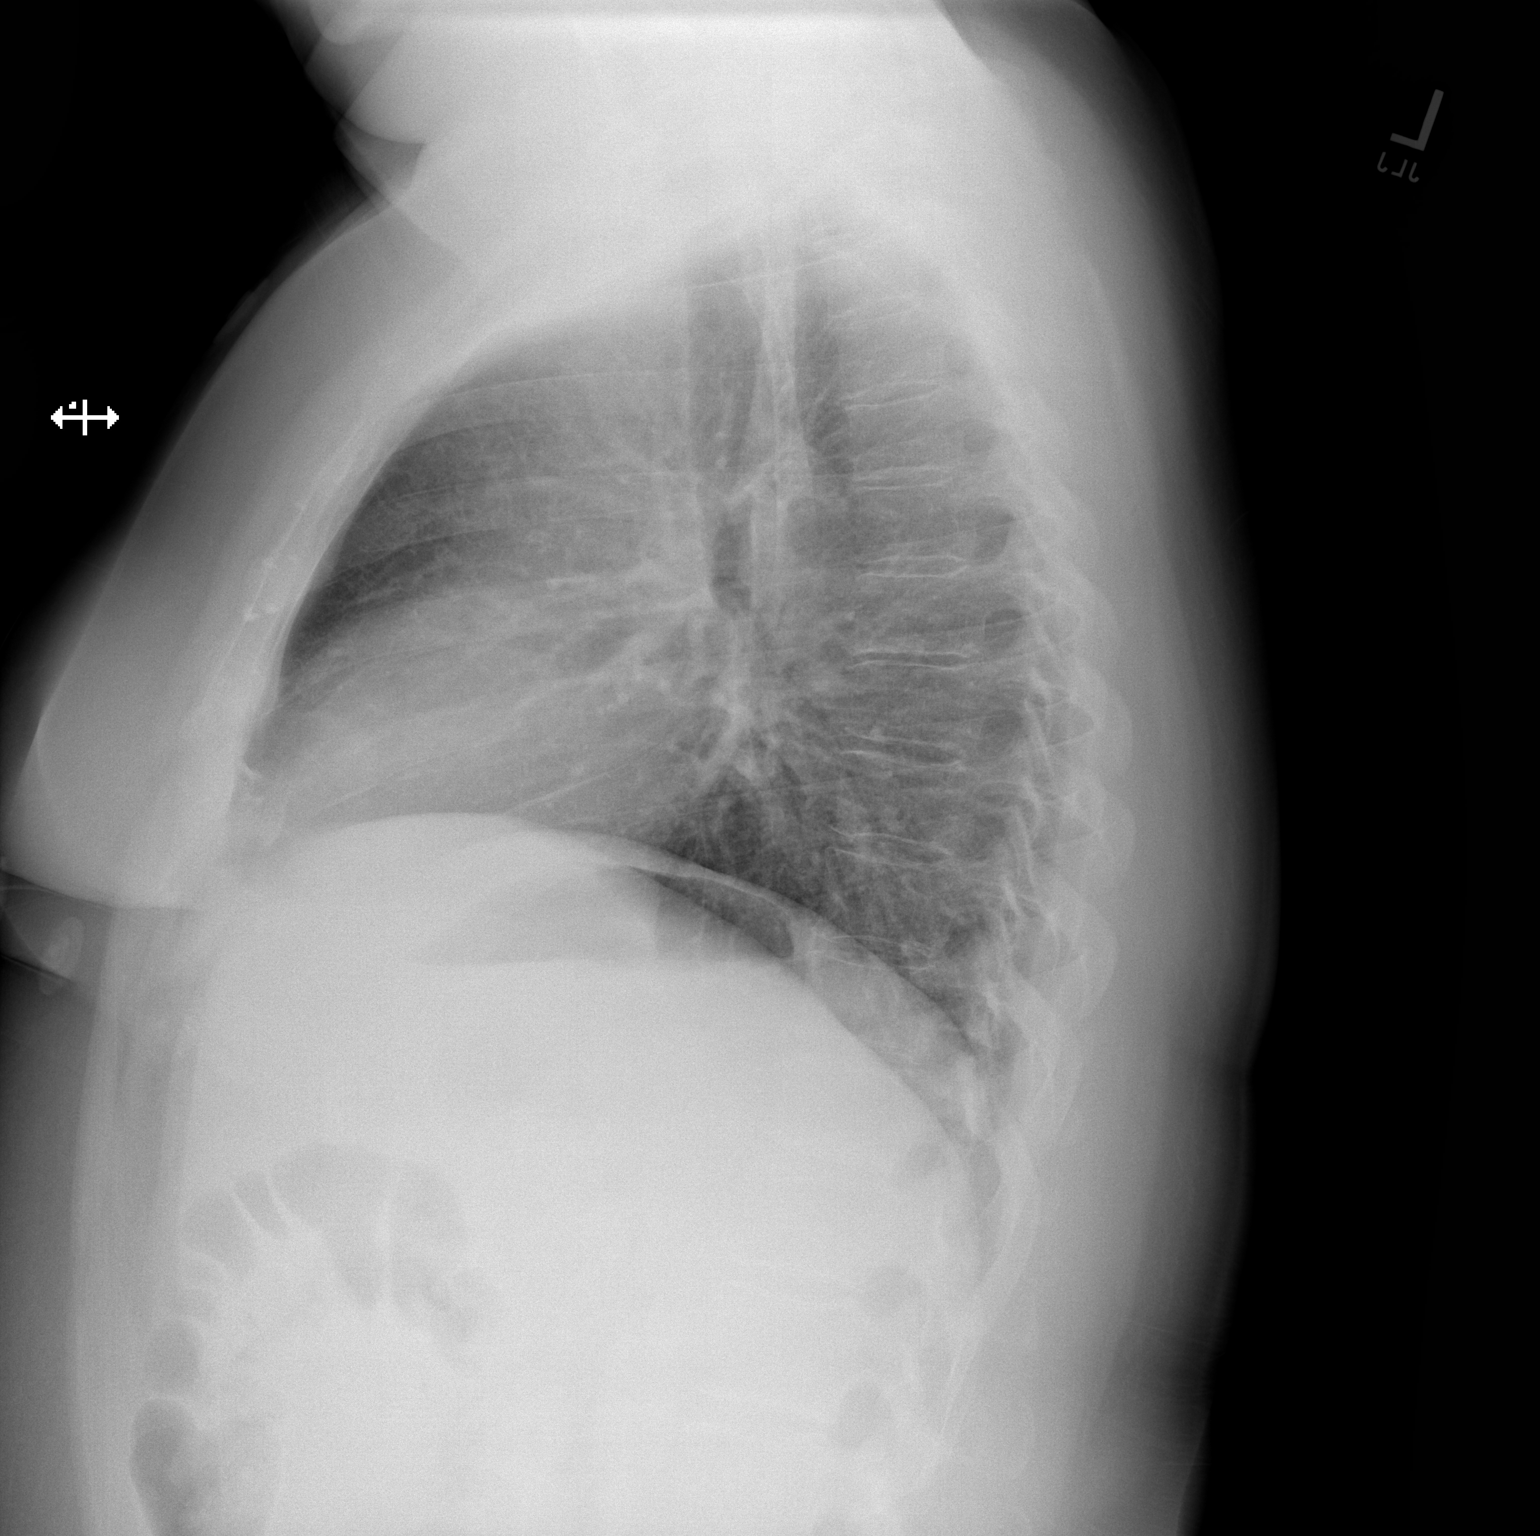

[2 of 2 positions shown; findings below may reference images not displayed]

FINDINGS: Heart and mediastinal contours are within normal limits. No focal
opacities or effusions. No acute bony abnormality.
IMPRESSION: No active cardiopulmonary disease.

## 2019-11-03 ENCOUNTER — Other Ambulatory Visit: Payer: Self-pay

## 2019-11-03 ENCOUNTER — Emergency Department (HOSPITAL_COMMUNITY)
Admission: EM | Admit: 2019-11-03 | Discharge: 2019-11-03 | Disposition: A | Discharge status: Home / Self Care | Payer: 59 | Attending: Emergency Medicine | Admitting: Emergency Medicine

## 2019-11-03 DIAGNOSIS — Z79899 Other long term (current) drug therapy: Secondary | ICD-10-CM | POA: Insufficient documentation

## 2019-11-03 DIAGNOSIS — M545 Low back pain, unspecified: Secondary | ICD-10-CM

## 2019-11-03 DIAGNOSIS — M5489 Other dorsalgia: Secondary | ICD-10-CM | POA: Diagnosis present

## 2019-11-03 DIAGNOSIS — F17210 Nicotine dependence, cigarettes, uncomplicated: Secondary | ICD-10-CM | POA: Insufficient documentation

## 2019-11-03 LAB — WET PREP, GENITAL
Clue Cells Wet Prep HPF POC: NONE SEEN
Sperm: NONE SEEN
Trich, Wet Prep: NONE SEEN
Yeast Wet Prep HPF POC: NONE SEEN

## 2019-11-03 LAB — PREGNANCY, URINE: Preg Test, Ur: NEGATIVE

## 2019-11-03 LAB — URINALYSIS, ROUTINE W REFLEX MICROSCOPIC
Bacteria, UA: NONE SEEN
Bilirubin Urine: NEGATIVE
Glucose, UA: NEGATIVE mg/dL
Ketones, ur: 5 mg/dL — AB
Leukocytes,Ua: NEGATIVE
Nitrite: NEGATIVE
Protein, ur: NEGATIVE mg/dL
Specific Gravity, Urine: 1.031 — ABNORMAL HIGH (ref 1.005–1.030)
pH: 5 (ref 5.0–8.0)

## 2019-11-03 LAB — RAPID HIV SCREEN (HIV 1/2 AB+AG)
HIV 1/2 Antibodies: NONREACTIVE
HIV-1 P24 Antigen - HIV24: NONREACTIVE

## 2019-11-03 MED ORDER — LIDOCAINE 5 % EX PTCH
2.0000 | MEDICATED_PATCH | CUTANEOUS | Status: DC
  Administered 2019-11-03: 2 via TRANSDERMAL
  Filled 2019-11-03: qty 2

## 2019-11-03 MED ORDER — METHOCARBAMOL 500 MG PO TABS: 500.0000 mg | ORAL_TABLET | Freq: Two times a day (BID) | ORAL | 0 refills | Status: DC

## 2019-11-03 MED ORDER — MELOXICAM 7.5 MG PO TABS: 7.5000 mg | ORAL_TABLET | Freq: Every day | ORAL | 0 refills | Status: DC

## 2019-11-03 NOTE — ED Notes (Signed)
Pt verbalizes understanding of DC instructions. Pt belongings returned and is ambulatory out of ED.  

## 2019-11-03 NOTE — Discharge Instructions (Addendum)
As discussed, your urine did not show any signs of infection. Your urine culture and gonorrhea/chlamydia tests are still pending. You will receive a phone call if they are positive. I am sending you home with a pain medication called Meloxicam. You can take it once a day as needed for pain. Do not mix with other over the counter medications. I am also sending you home with Robaxin which is a muscle relaxer. Medicine can cause drowsiness, so do not drive or operate machinery while on the medication. You may buy over the counter Voltaren gel as needed for pain. If your symptoms do not improve within the next week, I have included the number for [facility]. Return to the ER for new or worsening symptoms.

## 2019-11-03 NOTE — ED Provider Notes (Signed)
[facility]-EMERGENCY DEPT Provider Note   CSN: [id number] Arrival date & time: 11/03/19  1508     History Chief Complaint  Patient presents with  . Back Pain    Tina SinclairSara Carvell is a 38 y.o. female with no significant past medical history who presents to the ED due to bilateral low back pain that has gradually worsened over the past 2 days.  Patient notes back pain is associated with decreased urination. Patient denies dysuria. Pain is worse with urination and movement. Patient admits to suprapubic cramps that started today with her menses. Patient admits to past UTIs many years ago which she notes feels similar to this episode. Patient admits to vaginal pruritis and dryness. Patient is sexually active with 1 partner without protection. Patient has tried Advil, motrin, and Tylenol with no relief. She notes goody powder is the only thing that has helped with her pain.  Patient denies fever, chills, nausea, and vomiting.  Patient denies chronic low back pain.  Patient denies recent injury. Patient denies numbness/tingling of lower extremities, lower extremity weakness, saddle paresthesias, and bowel/bladder incontinence. Patient notes she just recently started going to the gym and could possibly be the cause of her pain. Patient denies chest pain and shortness of breath.   No past medical history on file.  There are no problems to display for this patient.   Past Surgical History:  Procedure Laterality Date  . TONSILLECTOMY       OB History   No obstetric history on file.     No family history on file.  Social History   Tobacco Use  . Smoking status: Current Every Day Smoker    Packs/day: 0.25    Types: Cigarettes  . Smokeless tobacco: Never Used  Substance Use Topics  . Alcohol use: Yes    Comment: rarely  . Drug use: No    Home Medications Prior to Admission medications   Medication Sig Start Date End Date Taking? Authorizing Provider  acetaminophen  (TYLENOL) 500 MG tablet Take 1 tablet (500 mg total) by mouth every 6 (six) hours as needed. 08/07/18   Law, Waylan BogaAlexandra M, PA-C  Aspirin-Caffeine (BC FAST PAIN RELIEF PO) Take 1 packet by mouth daily as needed (pain).    [provider]  benzonatate (TESSALON) 100 MG capsule Take 1 capsule (100 mg total) by mouth every 8 (eight) hours. 08/07/18   Law, Waylan BogaAlexandra M, PA-C  cetirizine (ZYRTEC ALLERGY) 10 MG tablet Take 1 tablet (10 mg total) by mouth daily. 08/07/18   Law, Waylan BogaAlexandra M, PA-C  clindamycin (CLEOCIN) 150 MG capsule Take 3 capsules (450 mg total) by mouth 3 (three) times daily. 08/07/18   Law, Waylan BogaAlexandra M, PA-C  ketorolac (TORADOL) 10 MG tablet Take 1 tablet (10 mg total) by mouth every 6 (six) hours as needed. MAKE SURE TO TAKE WITH FOOD. 08/07/18   Emi HolesLaw, Alexandra M, PA-C  meloxicam (MOBIC) 7.5 MG tablet Take 1 tablet (7.5 mg total) by mouth daily. 11/03/19   Mannie StabileAberman, Trannie Bardales C, PA-C  methocarbamol (ROBAXIN) 500 MG tablet Take 1 tablet (500 mg total) by mouth 2 (two) times daily. 11/03/19   Mannie StabileAberman, Isabellarose Kope C, PA-C  triamcinolone cream (KENALOG) 0.1 % Apply 1 application topically 2 (two) times daily. 06/15/18   Mortis, Jerrel IvoryGabrielle I, PA-C  venlafaxine XR (EFFEXOR-XR) 150 MG 24 hr capsule Take 150 mg by mouth daily. 10/29/14   [provider]    Allergies    Codeine and Penicillins  Review of Systems  Review of Systems  Constitutional: Negative for chills and fever.  Respiratory: Negative for shortness of breath.   Cardiovascular: Negative for chest pain.  Gastrointestinal: Positive for abdominal pain (suprapubic cramping). Negative for diarrhea, nausea and vomiting.  Genitourinary: Positive for decreased urine volume, flank pain and vaginal pain (itching). Negative for dysuria and hematuria.  Musculoskeletal: Positive for back pain (bilateral low back pain).    Physical Exam Updated Vital Signs BP 134/72 (BP Location: Right Arm)   Pulse 84   Temp 98.1 F (36.7 C)  (Oral)   Resp 17   SpO2 98%   Physical Exam Vitals and nursing note reviewed. Exam conducted with a chaperone present.  Constitutional:      General: She is not in acute distress.    Appearance: She is not ill-appearing.  HENT:     Head: Normocephalic.  Eyes:     Conjunctiva/sclera: Conjunctivae normal.  Cardiovascular:     Rate and Rhythm: Normal rate and regular rhythm.     Pulses: Normal pulses.     Heart sounds: Normal heart sounds. No murmur. No friction rub. No gallop.   Pulmonary:     Effort: Pulmonary effort is normal.     Breath sounds: Normal breath sounds.  Abdominal:     General: Abdomen is flat. Bowel sounds are normal. There is no distension.     Palpations: Abdomen is soft.     Tenderness: There is abdominal tenderness. There is right CVA tenderness and left CVA tenderness. There is no guarding.     Comments: Suprapubic tenderness. No guarding or rebound  Genitourinary:    Exam position: Supine.     Vagina: Vaginal discharge and bleeding present. No tenderness.     Cervix: Cervical bleeding present. No cervical motion tenderness or erythema.     Uterus: Normal.      Adnexa: Right adnexa normal and left adnexa normal.       Right: No tenderness.         Left: No tenderness.    Musculoskeletal:     Cervical back: Neck supple.     Right lower leg: No edema.     Left lower leg: No edema.     Comments: No T-spine and L-spine midline tenderness, no stepoff or deformity, reproducible lumbar paraspinal tenderness No leg edema bilaterally Patient moves all extremities without difficulty. DP/PT pulses 2+ and equal bilaterally Sensation grossly intact bilaterally Strength of knee flexion and extension is 5/5 Plantar and dorsiflexion of ankle 5/5 Achilles and patellar reflexes present and equal Able to ambulate without difficulty   Skin:    General: Skin is warm.  Neurological:     General: No focal deficit present.     Mental Status: She is alert.     ED  Results / Procedures / Treatments   Labs (all labs ordered are listed, but only abnormal results are displayed) Labs Reviewed  WET PREP, GENITAL - Abnormal; Notable for the following components:      Result Value   WBC, Wet Prep HPF POC FEW (*)    All other components within normal limits  URINALYSIS, ROUTINE W REFLEX MICROSCOPIC - Abnormal; Notable for the following components:   Specific Gravity, Urine 1.031 (*)    Hgb urine dipstick MODERATE (*)    Ketones, ur 5 (*)    All other components within normal limits  URINE CULTURE  RAPID HIV SCREEN (HIV 1/2 AB+AG)  PREGNANCY, URINE  RPR  GC/CHLAMYDIA PROBE AMP ([place]) NOT AT  [facility]    EKG None  Radiology No results found.  Procedures Procedures (including critical care time)  Medications Ordered in ED Medications - No data to display  ED Course  I have reviewed the triage vital signs and the nursing notes.  Pertinent labs & imaging results that were available during my care of the patient were reviewed by me and considered in my medical decision making (see chart for details).    MDM Rules/Calculators/A&P                      38 year old female presents to the ED due to bilateral low back pain associated with decreased urine. Patient has history of UTIs in the past and notes this feels similar to her past UTIs. Patient notes she recently started going to the gym, but denies back injury. Vitals all within normal limits. Patient in no acute distress and rather well appearing. Lumbar paraspinal tenderness. No thoracic or lumbar midline tenderness. Patient able to ambulate in the ED without difficulty. Normal GU exam with no CMT or adnexal tenderness. Suspect pain related to UTI vs. Pyelonephritis vs. MSK etiology. History without red flags (cancer, IVDU, weakness, saddle anesthesia, trauma, weight loss). Doubt cauda equina or disc herniation due to lack of saddle anesthesia/bowel or bladder incontinence or urinary retention,  normal gait and reassuring physical examination without neurologic deficits.   Pregnancy test negative. Wet prep reassuring with no clue cells, yeast, or trichomonas. UA negative for signs of infection, but moderate amount of hematuria, likely due to menses. Doubt UTI or pyelonephritis at this time given unremarkable urine. Urine culture pending. GC/Chlamydia test pending. Patient deferred prophylactic treatment and would prefer to wait for results. Suspect pain related to MSK etiology given her new workout routine.  Will manage patient conservatively at this time. NSAIDs, back exercises/stretches, heat therapy and follow up with PCP if symptoms do not resolve in 3-4 weeks. Patient offered muscle relaxer for comfort at night. Patient advised medicine can cause drowsiness and not to drive or operate machinery while on the medication. Strict ED precautions discussed with patient. Patient states understanding and agrees to plan. Patient discharged home in no acute distress and stable vitals   Final Clinical Impression(s) / ED Diagnoses Final diagnoses:  Acute bilateral low back pain without sciatica    Rx / DC Orders ED Discharge Orders         Ordered    meloxicam (MOBIC) 7.5 MG tablet  Daily     11/03/19 1711    methocarbamol (ROBAXIN) 500 MG tablet  2 times daily     11/03/19 1711           Jesusita Oka 11/04/19 0143    Tilden Fossa, MD 11/07/19 1733

## 2019-11-03 NOTE — ED Triage Notes (Signed)
Pt arrived POV from home CC lower back pain X2 days, Pt ambulatory into ED reports "Orion" only relief. Pt reports today is 1st day of this months menses

## 2019-11-04 LAB — URINE CULTURE: Culture: NO GROWTH

## 2019-11-04 LAB — RPR: RPR Ser Ql: NONREACTIVE

## 2019-11-07 LAB — GC/CHLAMYDIA PROBE AMP (~~LOC~~) NOT AT ARMC
Chlamydia: POSITIVE — AB
Neisseria Gonorrhea: NEGATIVE

## 2019-11-08 ENCOUNTER — Ambulatory Visit: Payer: Self-pay | Admitting: Adult Health Nurse Practitioner

## 2019-11-08 ENCOUNTER — Telehealth: Payer: Self-pay | Admitting: Medical

## 2019-11-08 DIAGNOSIS — A749 Chlamydial infection, unspecified: Secondary | ICD-10-CM

## 2019-11-08 MED ORDER — AZITHROMYCIN 250 MG PO TABS: 1000.0000 mg | ORAL_TABLET | Freq: Once | ORAL | 0 refills | Status: AC

## 2019-11-08 NOTE — Telephone Encounter (Addendum)
Doreen Salvage tested positive for  Chlamydia. Patient was called by RN and allergies and pharmacy confirmed. Rx sent to pharmacy of choice.   Luvenia Redden, PA-C 11/08/2019 9:49 AM     ----- Message from Bjorn Loser, RN sent at 11/07/2019  5:30 PM EST ----- This patient tested positive for :  chlamydia  She is allergic to :  " Codeine and Penicillin", I have informed the patient of her results and confirmed her pharmacy is correct in her chart. Please send Rx.   Thank you,   Bjorn Loser, RN   Results faxed to [facility] Department.

## 2019-11-13 ENCOUNTER — Encounter: Payer: Self-pay | Admitting: Adult Health Nurse Practitioner

## 2019-12-31 ENCOUNTER — Emergency Department (HOSPITAL_COMMUNITY)
Admission: EM | Admit: 2019-12-31 | Discharge: 2019-12-31 | Disposition: A | Discharge status: Home / Self Care | Payer: 59 | Attending: Emergency Medicine | Admitting: Emergency Medicine

## 2019-12-31 ENCOUNTER — Encounter (HOSPITAL_COMMUNITY): Payer: Self-pay

## 2019-12-31 ENCOUNTER — Other Ambulatory Visit: Payer: Self-pay

## 2019-12-31 DIAGNOSIS — Z79899 Other long term (current) drug therapy: Secondary | ICD-10-CM | POA: Insufficient documentation

## 2019-12-31 DIAGNOSIS — F17210 Nicotine dependence, cigarettes, uncomplicated: Secondary | ICD-10-CM | POA: Insufficient documentation

## 2019-12-31 DIAGNOSIS — Z202 Contact with and (suspected) exposure to infections with a predominantly sexual mode of transmission: Secondary | ICD-10-CM

## 2019-12-31 NOTE — ED Notes (Signed)
Went to check on patient and patient not in room, found gown laying on bed, checked bathrooms, patient can't be located

## 2019-12-31 NOTE — ED Notes (Signed)
Have been attempting to obtain blood for 20 minutes. Unable to locate PT

## 2019-12-31 NOTE — ED Triage Notes (Addendum)
Patient states her significant other was treated for gonorrhea and chlymadia yesterday. Patient stated, "I am not having any symptoms." Paient states she had sexual intercourse with him a week ago.

## 2019-12-31 NOTE — ED Provider Notes (Signed)
[facility]-EMERGENCY DEPT Provider Note   CSN: [id number] Arrival date & time: 12/31/19  1755     History Chief Complaint  Patient presents with  . possible STD    Tina Warren is a 39 y.o. female.  HPI   39 year old female presenting for evaluation of STD exposure.  States that her significant other informed her he was treated for gonorrhea and chlamydia yesterday and she is afraid she has been exposed.  She denies any vaginal discharge, abdominal pain or urinary symptoms.  She is reporting vaginal bleeding as she is currently on her menstrual cycle.  She denies any complaints.  History reviewed. No pertinent past medical history.  There are no problems to display for this patient.   Past Surgical History:  Procedure Laterality Date  . TONSILLECTOMY       OB History   No obstetric history on file.     Family History  Family history unknown: Yes    Social History   Tobacco Use  . Smoking status: Current Every Day Smoker    Packs/day: 0.25    Types: Cigarettes  . Smokeless tobacco: Never Used  Substance Use Topics  . Alcohol use: Yes    Comment: rarely  . Drug use: No    Home Medications Prior to Admission medications   Medication Sig Start Date End Date Taking? Authorizing Provider  acetaminophen (TYLENOL) 500 MG tablet Take 1 tablet (500 mg total) by mouth every 6 (six) hours as needed. 08/07/18   Law, Waylan Boga, PA-C  Aspirin-Caffeine (BC FAST PAIN RELIEF PO) Take 1 packet by mouth daily as needed (pain).    [provider]  benzonatate (TESSALON) 100 MG capsule Take 1 capsule (100 mg total) by mouth every 8 (eight) hours. 08/07/18   Law, Waylan Boga, PA-C  cetirizine (ZYRTEC ALLERGY) 10 MG tablet Take 1 tablet (10 mg total) by mouth daily. 08/07/18   Law, Waylan Boga, PA-C  clindamycin (CLEOCIN) 150 MG capsule Take 3 capsules (450 mg total) by mouth 3 (three) times daily. 08/07/18   Law, Waylan Boga, PA-C  ketorolac (TORADOL)  10 MG tablet Take 1 tablet (10 mg total) by mouth every 6 (six) hours as needed. MAKE SURE TO TAKE WITH FOOD. 08/07/18   Emi Holes, PA-C  meloxicam (MOBIC) 7.5 MG tablet Take 1 tablet (7.5 mg total) by mouth daily. 11/03/19   Mannie Stabile, PA-C  methocarbamol (ROBAXIN) 500 MG tablet Take 1 tablet (500 mg total) by mouth 2 (two) times daily. 11/03/19   Mannie Stabile, PA-C  triamcinolone cream (KENALOG) 0.1 % Apply 1 application topically 2 (two) times daily. 06/15/18   Mortis, Jerrel Ivory I, PA-C  venlafaxine XR (EFFEXOR-XR) 150 MG 24 hr capsule Take 150 mg by mouth daily. 10/29/14   [provider]    Allergies    Codeine and Penicillins  Review of Systems   Review of Systems  Constitutional: Negative for chills and fever.  HENT: Negative for ear pain and sore throat.   Eyes: Negative for visual disturbance.  Respiratory: Negative for cough and shortness of breath.   Cardiovascular: Negative for chest pain.  Gastrointestinal: Negative for abdominal pain, constipation, diarrhea, nausea and vomiting.  Genitourinary: Positive for vaginal bleeding (on menses). Negative for dysuria, frequency, hematuria, urgency and vaginal discharge.  Musculoskeletal: Negative for back pain.  Skin: Negative for rash.  Neurological: Negative for headaches.  All other systems reviewed and are negative.   Physical Exam Updated Vital Signs BP Marland Kitchen)  145/84 (BP Location: Left Arm)   Pulse 80   Temp 98.1 F (36.7 C)   Ht 5\' 5"  (1.651 m)   Wt 99.3 kg   LMP 12/31/2019   SpO2 100%   BMI 36.44 kg/m   Physical Exam Vitals and nursing note reviewed.  Constitutional:      General: She is not in acute distress.    Appearance: She is well-developed.  HENT:     Head: Normocephalic and atraumatic.  Eyes:     Conjunctiva/sclera: Conjunctivae normal.  Cardiovascular:     Rate and Rhythm: Normal rate and regular rhythm.  Pulmonary:     Effort: Pulmonary effort is normal. No respiratory  distress.  Abdominal:     General: Bowel sounds are normal. There is no distension.     Palpations: Abdomen is soft.     Tenderness: There is no abdominal tenderness. There is no guarding or rebound.  Genitourinary:    Comments: Pt declines pelvic exam Musculoskeletal:     Cervical back: Neck supple.  Skin:    General: Skin is warm and dry.  Neurological:     Mental Status: She is alert.     ED Results / Procedures / Treatments   Labs (all labs ordered are listed, but only abnormal results are displayed) Labs Reviewed  WET PREP, GENITAL  HIV ANTIBODY (ROUTINE TESTING W REFLEX)  RPR  POC URINE PREG, ED  GC/CHLAMYDIA PROBE AMP () NOT AT [facility]    EKG None  Radiology No results found.  Procedures Procedures (including critical care time)  Medications Ordered in ED Medications - No data to display  ED Course  I have reviewed the triage vital signs and the nursing notes.  Pertinent labs & imaging results that were available during my care of the patient were reviewed by me and considered in my medical decision making (see chart for details).    MDM Rules/Calculators/A&P                      39 year old female presenting for evaluation for exposure to an STD.  States that her boyfriend was treated for gonorrhea and chlamydia.  She is currently asymptomatic at this time and does not have any vaginal discharge, pelvic pain or other symptoms.  She states she was just tested for STDs a month ago and had a pelvic exam at that time.  She states she really does not want to have another pelvic exam though she would like to be tested and treated for STDs.  Discussed possibility of PID and inability to diagnose this without pelvic exam.  Patient would like to self swab.    Pt eloped from the ED prior to the collection of her tests.   Final Clinical Impression(s) / ED Diagnoses Final diagnoses:  None    Rx / DC Orders ED Discharge Orders    None       Bishop Dublin 12/31/19 1947    Davonna Belling, MD 01/18/20 [phone]

## 2020-03-08 ENCOUNTER — Encounter (HOSPITAL_COMMUNITY): Payer: Self-pay

## 2020-03-08 ENCOUNTER — Ambulatory Visit (HOSPITAL_COMMUNITY)
Admission: EM | Admit: 2020-03-08 | Discharge: 2020-03-08 | Disposition: A | Discharge status: Home / Self Care | Payer: 59 | Attending: Family Medicine | Admitting: Family Medicine

## 2020-03-08 ENCOUNTER — Other Ambulatory Visit: Payer: Self-pay

## 2020-03-08 DIAGNOSIS — R112 Nausea with vomiting, unspecified: Secondary | ICD-10-CM | POA: Diagnosis present

## 2020-03-08 DIAGNOSIS — J069 Acute upper respiratory infection, unspecified: Secondary | ICD-10-CM | POA: Diagnosis not present

## 2020-03-08 DIAGNOSIS — R197 Diarrhea, unspecified: Secondary | ICD-10-CM | POA: Diagnosis not present

## 2020-03-08 DIAGNOSIS — Z885 Allergy status to narcotic agent status: Secondary | ICD-10-CM | POA: Diagnosis not present

## 2020-03-08 DIAGNOSIS — Z20822 Contact with and (suspected) exposure to covid-19: Secondary | ICD-10-CM | POA: Diagnosis not present

## 2020-03-08 DIAGNOSIS — Z880 Allergy status to penicillin: Secondary | ICD-10-CM | POA: Diagnosis not present

## 2020-03-08 DIAGNOSIS — F17210 Nicotine dependence, cigarettes, uncomplicated: Secondary | ICD-10-CM | POA: Insufficient documentation

## 2020-03-08 MED ORDER — BENZONATATE 200 MG PO CAPS: 200.0000 mg | ORAL_CAPSULE | Freq: Three times a day (TID) | ORAL | 0 refills | Status: AC | PRN

## 2020-03-08 MED ORDER — ONDANSETRON 4 MG PO TBDP: 4.0000 mg | ORAL_TABLET | Freq: Three times a day (TID) | ORAL | 0 refills | Status: DC | PRN

## 2020-03-08 NOTE — ED Triage Notes (Signed)
Pt is here with NVD that started 2 days ago, states if she drinks a sip of water she has diarrhea. Pt has taken Motrin, Tylenol to relieve discomfort.

## 2020-03-08 NOTE — Discharge Instructions (Signed)
Covid test pending, monitor my chart for results Please use Zofran/ondansetron every 8 hours as needed for nausea and vomiting, dissolves in mouth Slowly transition your diet back to normal as symptoms improving, focus on plenty of fluids, Gatorade or Pedialyte if not eating a lot of solids May try some Imodium/Pepto-Bismol to slow stools Tessalon/benzonatate every 8 hours as needed for cough May supplement with over-the-counter Sudafed, Mucinex, Flonase for further relief of nasal congestion  Please rest and drink plenty fluids, follow-up if any symptoms not improving or worsening

## 2020-03-08 NOTE — ED Provider Notes (Signed)
[place]    CSN: [id number] Arrival date & time: 03/08/20  6812      History   Chief Complaint Chief Complaint  Patient presents with  . NVD    HPI Tina Warren is a 39 y.o. female history of prior tonsillectomy presenting today for evaluation of URI symptoms of nausea vomiting and diarrhea.  Patient notes that approximately 2 days ago she began to have nausea vomiting and diarrhea.  Believes the nausea and vomiting has eased off some last night, but continues to have frequent bowels with any oral intake.  She has also had some nasal congestion and cough.  Denies any known fevers.  Using Tylenol and ibuprofen.  Denies any close sick contacts.  Denies significant abdominal pain.  HPI  History reviewed. No pertinent past medical history.  There are no problems to display for this patient.   Past Surgical History:  Procedure Laterality Date  . TONSILLECTOMY      OB History   No obstetric history on file.      Home Medications    Prior to Admission medications   Medication Sig Start Date End Date Taking? Authorizing Provider  acetaminophen (TYLENOL) 500 MG tablet Take 1 tablet (500 mg total) by mouth every 6 (six) hours as needed. 08/07/18   Law, Bea Graff, PA-C  Aspirin-Caffeine (BC FAST PAIN RELIEF PO) Take 1 packet by mouth daily as needed (pain).    [provider]  azithromycin (ZITHROMAX) 250 MG tablet Take 1,000 mg by mouth once. 11/08/19   [provider]  benzonatate (TESSALON) 200 MG capsule Take 1 capsule (200 mg total) by mouth 3 (three) times daily as needed for up to 7 days for cough. 03/08/20 03/15/20  Nandini Bogdanski C, PA-C  cetirizine (ZYRTEC ALLERGY) 10 MG tablet Take 1 tablet (10 mg total) by mouth daily. 08/07/18   Law, Bea Graff, PA-C  ondansetron (ZOFRAN ODT) 4 MG disintegrating tablet Take 1 tablet (4 mg total) by mouth every 8 (eight) hours as needed for nausea or vomiting. 03/08/20   Tatijana Bierly C, PA-C    venlafaxine XR (EFFEXOR-XR) 150 MG 24 hr capsule Take 150 mg by mouth daily. 10/29/14 03/08/20  [provider]    Family History Family History  Family history unknown: Yes    Social History Social History   Tobacco Use  . Smoking status: Current Every Day Smoker    Packs/day: 0.25    Types: Cigarettes  . Smokeless tobacco: Never Used  Substance Use Topics  . Alcohol use: Yes    Comment: rarely  . Drug use: No     Allergies   Codeine and Penicillins   Review of Systems Review of Systems  Constitutional: Positive for fatigue. Negative for activity change, appetite change, chills and fever.  HENT: Positive for congestion and rhinorrhea. Negative for ear pain, sinus pressure, sore throat and trouble swallowing.   Eyes: Negative for discharge and redness.  Respiratory: Positive for cough. Negative for chest tightness and shortness of breath.   Cardiovascular: Negative for chest pain.  Gastrointestinal: Positive for diarrhea, nausea and vomiting. Negative for abdominal pain.  Musculoskeletal: Negative for myalgias.  Skin: Negative for rash.  Neurological: Positive for headaches. Negative for dizziness and light-headedness.     Physical Exam Triage Vital Signs ED Triage Vitals  Enc Vitals Group     BP 03/08/20 1003 (!) 127/54     Pulse Rate 03/08/20 1003 75     Resp 03/08/20 1003 18  Temp 03/08/20 1003 99.2 F (37.3 C)     Temp Source 03/08/20 1003 Oral     SpO2 03/08/20 1003 100 %     Weight 03/08/20 1008 217 lb 6.4 oz (98.6 kg)     Height --      Head Circumference --      Peak Flow --      Pain Score 03/08/20 1000 0     Pain Loc --      Pain Edu? --      Excl. in GC? --    No data found.  Updated Vital Signs BP (!) 127/54 (BP Location: Left Arm)   Pulse 75   Temp 99.2 F (37.3 C) (Oral)   Resp 18   Wt 217 lb 6.4 oz (98.6 kg)   LMP 02/23/2020   SpO2 100%   BMI 36.18 kg/m   Visual Acuity Right Eye Distance:   Left Eye Distance:    Bilateral Distance:    Right Eye Near:   Left Eye Near:    Bilateral Near:     Physical Exam Vitals and nursing note reviewed.  Constitutional:      General: She is not in acute distress.    Appearance: She is well-developed.  HENT:     Head: Normocephalic and atraumatic.     Ears:     Comments: Bilateral ears without tenderness to palpation of external auricle, tragus and mastoid, EAC's without erythema or swelling, TM's with good bony landmarks and cone of light. Non erythematous.    Mouth/Throat:     Comments: Oral mucosa pink and moist, no tonsillar enlargement or exudate. Posterior pharynx patent and nonerythematous, no uvula deviation or swelling. Normal phonation. Eyes:     Conjunctiva/sclera: Conjunctivae normal.  Cardiovascular:     Rate and Rhythm: Normal rate and regular rhythm.     Heart sounds: No murmur.  Pulmonary:     Effort: Pulmonary effort is normal. No respiratory distress.     Breath sounds: Normal breath sounds.     Comments: Breathing comfortably at rest, CTABL, no wheezing, rales or other adventitious sounds auscultated  Occasional cough in room Abdominal:     Palpations: Abdomen is soft.     Tenderness: There is no abdominal tenderness.     Comments: Soft, nondistended, nontender to light deep palpation throughout abdomen  Musculoskeletal:     Cervical back: Neck supple.  Skin:    General: Skin is warm and dry.  Neurological:     Mental Status: She is alert.      UC Treatments / Results  Labs (all labs ordered are listed, but only abnormal results are displayed) Labs Reviewed  SARS CORONAVIRUS 2 (TAT 6-24 HRS)    EKG   Radiology No results found.  Procedures Procedures (including critical care time)  Medications Ordered in UC Medications - No data to display  Initial Impression / Assessment and Plan / UC Course  I have reviewed the triage vital signs and the nursing notes.  Pertinent labs & imaging results that were available  during my care of the patient were reviewed by me and considered in my medical decision making (see chart for details).     2 days of URI symptoms and GI upset.  Most likely viral etiology.  Covid test pending.  Recommending symptomatic and supportive care.  Rest fluids.  No abdominal pain, do not suspect underlying abdominal emergency.  Discussed strict return precautions. Patient verbalized understanding and is agreeable with plan.  Final Clinical Impressions(s) / UC Diagnoses   Final diagnoses:  Viral URI with cough  Nausea vomiting and diarrhea     Discharge Instructions     Covid test pending, monitor my chart for results Please use Zofran/ondansetron every 8 hours as needed for nausea and vomiting, dissolves in mouth Slowly transition your diet back to normal as symptoms improving, focus on plenty of fluids, Gatorade or Pedialyte if not eating a lot of solids May try some Imodium/Pepto-Bismol to slow stools Tessalon/benzonatate every 8 hours as needed for cough May supplement with over-the-counter Sudafed, Mucinex, Flonase for further relief of nasal congestion  Please rest and drink plenty fluids, follow-up if any symptoms not improving or worsening   ED Prescriptions    Medication Sig Dispense Auth. Provider   benzonatate (TESSALON) 200 MG capsule Take 1 capsule (200 mg total) by mouth 3 (three) times daily as needed for up to 7 days for cough. 28 capsule Ketara Cavness C, PA-C   ondansetron (ZOFRAN ODT) 4 MG disintegrating tablet Take 1 tablet (4 mg total) by mouth every 8 (eight) hours as needed for nausea or vomiting. 20 tablet Reinhart Saulters, Cameron C, PA-C     PDMP not reviewed this encounter.   Westyn Keatley, [place] C, PA-C 03/08/20 1050

## 2020-03-09 LAB — SARS CORONAVIRUS 2 (TAT 6-24 HRS): SARS Coronavirus 2: NEGATIVE

## 2020-04-09 ENCOUNTER — Other Ambulatory Visit: Payer: Self-pay

## 2020-04-09 ENCOUNTER — Ambulatory Visit (INDEPENDENT_AMBULATORY_CARE_PROVIDER_SITE_OTHER): Payer: 59 | Admitting: Licensed Clinical Social Worker

## 2020-04-09 DIAGNOSIS — F411 Generalized anxiety disorder: Secondary | ICD-10-CM
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
## 2020-04-10 ENCOUNTER — Encounter (HOSPITAL_COMMUNITY): Payer: Self-pay | Admitting: Psychiatry

## 2020-04-10 ENCOUNTER — Other Ambulatory Visit: Payer: Self-pay

## 2020-04-10 ENCOUNTER — Ambulatory Visit (INDEPENDENT_AMBULATORY_CARE_PROVIDER_SITE_OTHER): Payer: 59 | Admitting: Psychiatry

## 2020-04-10 DIAGNOSIS — F411 Generalized anxiety disorder: Secondary | ICD-10-CM

## 2020-04-10 DIAGNOSIS — F331 Major depressive disorder, recurrent, moderate: Secondary | ICD-10-CM | POA: Diagnosis not present

## 2020-04-10 DIAGNOSIS — F900 Attention-deficit hyperactivity disorder, predominantly inattentive type: Secondary | ICD-10-CM

## 2020-04-10 MED ORDER — GUANFACINE HCL ER 1 MG PO TB24: 1.0000 mg | ORAL_TABLET | Freq: Two times a day (BID) | ORAL | 1 refills | Status: DC

## 2020-04-10 MED ORDER — VENLAFAXINE HCL ER 75 MG PO CP24: 75.0000 mg | ORAL_CAPSULE | Freq: Every day | ORAL | 1 refills | Status: DC

## 2020-04-10 NOTE — Progress Notes (Signed)
Psychiatric Initial Adult Assessment   Patient Identification: Tina Warren MRN:  [id number] Date of Evaluation:  04/10/2020   Referral Source: Vesta Mixer  Chief Complaint:   " I got off my medications about a year ago and have been having a hard time lately."  Visit Diagnosis:    ICD-10-CM   1. MDD (major depressive disorder), recurrent episode, moderate (HCC)  F33.1 venlafaxine XR (EFFEXOR XR) 75 MG 24 hr capsule  2. Generalized anxiety disorder  F41.1 venlafaxine XR (EFFEXOR XR) 75 MG 24 hr capsule  3. Attention deficit hyperactivity disorder (ADHD), predominantly inattentive type  F90.0 guanFACINE (INTUNIV) 1 MG TB24 ER tablet    History of Present Illness: This is a 39 year old female with history of MDD, anxiety, ADHD inattentive type now seen for psychiatric evaluation.  She informed that she has a long history of depression and anxiety. Patient reported that she used to be on a combination of medications that was working effectively however she stopped the medications about a year ago due to running out of insurance and not being able to pay for her medications.  She informed that she was taking Effexor XR 150 mg daily and Intuniv 1 mg twice daily.  She also informed taking propanolol 10 mg as needed which she took only 1-2 times per week for anxiety.   Lately she has been having depressed mood, crying spells, low energy levels, poor appetite and poor sleep.  She barely gets 4 hours of sleep and has a hard time falling asleep and staying asleep.  She denied any suicidal ideations or plans.  She denied any suicidal attempts. She reported that she feels tired all the time and does not have the energy to do anything.  She will not even get out of her bed unless she absolutely has to.  She has a hard time focusing on anything.  She stated that she is distracted easily and is unable to stay on track while reading a book or watching TV.  She has missed her exit several times because of her  distractibility. She reported that she feels anxious all the time and that makes her feel paranoid. She stated that sometimes she wonders if her phone is being tracked and if her GPS system in the car is also being tracked.  She stated that she has tried self-medicating by using marijuana however that made her paranoia worse.  She reported that she stopped using marijuana since then. She drinks alcohol occasionally.  She denied using any other illicit drugs. She denied any symptom suggestive of hypomania or mania.  She denied any symptom suggestive of PTSD.  Previous Psychotropic Medications: Yes. Patient reported taking Xanax for anxiety in the past however did not take it for too long as it made her feel very sleepy during the daytime.  She also reported trying Adderall and other stimulants for ADHD symptoms.  She stated that the stimulants did not make her feel like herself and she had a hard time going to sleep.  She reported that she did really well on Intuniv.  She also reported taking melatonin for sleep.  She has not been taking it lately and is planning to go back to taking it for sleep again.  Past Psychiatric History: MDD, generalized anxiety disorder, ADHD inattentive type  Substance Abuse History in the last 12 months:  Yes.  -Was self-medicating by smoking marijuana but has stopped doing so over the past few weeks.  Consequences of Substance Abuse: Medical Consequences:  Paranoid  ideations  Past Medical History: History reviewed. No pertinent past medical history.  Past Surgical History:  Procedure Laterality Date   TONSILLECTOMY      Family Psychiatric History: Mother-anxiety, depression.  45 year old daughter-depression, anxiety  Family History:  Family History  Family history unknown: Yes    Social History:   Social History   Socioeconomic History   Marital status: Married    Spouse name: Not on file   Number of children: Not on file   Years of education: Not  on file   Highest education level: Not on file  Occupational History   Not on file  Tobacco Use   Smoking status: Current Every Day Smoker    Packs/day: 0.25    Types: Cigarettes   Smokeless tobacco: Never Used  Substance and Sexual Activity   Alcohol use: Yes    Comment: rarely   Drug use: No   Sexual activity: Yes    Birth control/protection: Surgical  Other Topics Concern   Not on file  Social History Narrative   Not on file   Social Determinants of Health   Financial Resource Strain:    Difficulty of Paying Living Expenses:   Food Insecurity:    Worried About Programme researcher, broadcasting/film/video in the Last Year:    Barista in the Last Year:   Transportation Needs:    Freight forwarder (Medical):    Lack of Transportation (Non-Medical):   Physical Activity:    Days of Exercise per Week:    Minutes of Exercise per Session:   Stress:    Feeling of Stress :   Social Connections:    Frequency of Communication with Friends and Family:    Frequency of Social Gatherings with Friends and Family:    Attends Religious Services:    Active Member of Clubs or Organizations:    Attends Banker Meetings:    Marital Status:     Additional Social History: Lives with her 33 year old daughter.  Has another 52 year old daughter who is in college at [employer].  Allergies:   Allergies  Allergen Reactions   Codeine Nausea Only and Nausea And Vomiting   Penicillins     Childhood Allergy Has patient had a PCN reaction causing immediate rash, facial/tongue/throat swelling, SOB or lightheadedness with hypotension: Unknown Has patient had a PCN reaction causing severe rash involving mucus membranes or skin necrosis: Unknown Has patient had a PCN reaction that required hospitalization: Unknown Has patient had a PCN reaction occurring within the last 10 years: Unknown If all of the above answers are "NO", then may proceed with Cephalosporin use.      Metabolic Disorder Labs: No results found for: HGBA1C, MPG No results found for: PROLACTIN No results found for: CHOL, TRIG, HDL, CHOLHDL, VLDL, LDLCALC No results found for: TSH  Therapeutic Level Labs: No results found for: LITHIUM No results found for: CBMZ No results found for: VALPROATE  Current Medications: Current Outpatient Medications  Medication Sig Dispense Refill   acetaminophen (TYLENOL) 500 MG tablet Take 1 tablet (500 mg total) by mouth every 6 (six) hours as needed. 30 tablet 0   Aspirin-Caffeine (BC FAST PAIN RELIEF PO) Take 1 packet by mouth daily as needed (pain).     cetirizine (ZYRTEC ALLERGY) 10 MG tablet Take 1 tablet (10 mg total) by mouth daily. 30 tablet 0   guanFACINE (INTUNIV) 1 MG TB24 ER tablet Take 1 tablet (1 mg total) by mouth 2 (two) times daily. 60  tablet 1   ondansetron (ZOFRAN ODT) 4 MG disintegrating tablet Take 1 tablet (4 mg total) by mouth every 8 (eight) hours as needed for nausea or vomiting. 20 tablet 0   venlafaxine XR (EFFEXOR XR) 75 MG 24 hr capsule Take 1 capsule (75 mg total) by mouth daily. 30 capsule 1   No current facility-administered medications for this visit.    Psychiatric Specialty Exam: Review of Systems  There were no vitals taken for this visit.There is no height or weight on file to calculate BMI.  General Appearance: Casual and Fairly Groomed  Eye Contact:  Good  Speech:  Clear and Coherent and Normal Rate  Volume:  Normal  Mood:  Anxious  Affect:  Congruent  Thought Process:  Goal Directed, Linear and Descriptions of Associations: Intact  Orientation:  Full (Time, Place, and Person)  Thought Content:  Logical  Suicidal Thoughts:  No  Homicidal Thoughts:  No  Memory:  Immediate;   Good Recent;   Good Remote;   Good  Judgement:  Fair  Insight:  Fair  Psychomotor Activity:  Normal  Concentration:  Concentration: Good and Attention Span: Good  Recall:  Good  Fund of Knowledge:Good  Language: Good   Akathisia:  Negative  Handed:  Right  AIMS (if indicated):  Not done  Assets:  Communication Skills Desire for Improvement Housing Social Support  ADL's:  Intact  Cognition: WNL  Sleep:  Poor    Assessment and Plan: 39 year old female with history of MDD, GAD, ADHD inattentive type now seen for psychiatric evaluation.  Patient has been off her medications for a year due to insurance reasons.  She is having ongoing depressive symptoms as well as anxiety symptoms.  She would like to get back on the regimen that was effective in the past.  She was restarted on Effexor XR 75 mg daily and Intuniv 1 mg twice daily.  Patient is going to be seeing a therapist regularly, had her therapy intake done yesterday. Patient stated that she would be needing financial assistance with her medications, she was advised to utilize [place] which provides financial assistance programs to patients.  1. MDD (major depressive disorder), recurrent episode, moderate (HCC)  -Restart venlafaxine XR (EFFEXOR XR) 75 MG 24 hr capsule; Take 1 capsule (75 mg total) by mouth daily.  Dispense: 30 capsule; Refill: 1  2. Generalized anxiety disorder  - venlafaxine XR (EFFEXOR XR) 75 MG 24 hr capsule; Take 1 capsule (75 mg total) by mouth daily.  Dispense: 30 capsule; Refill: 1  3. Attention deficit hyperactivity disorder (ADHD), predominantly inattentive type -Restart guanFACINE (INTUNIV) 1 MG TB24 ER tablet; Take 1 tablet (1 mg total) by mouth 2 (two) times daily.  Dispense: 60 tablet; Refill: 1  Follow-up in 4 weeks.  [place] Crane, MD 6/2/20211:34 PM

## 2020-04-16 ENCOUNTER — Ambulatory Visit (HOSPITAL_COMMUNITY): Payer: 59 | Admitting: Licensed Clinical Social Worker

## 2020-04-18 ENCOUNTER — Ambulatory Visit (HOSPITAL_COMMUNITY): Payer: 59 | Admitting: Licensed Clinical Social Worker

## 2020-05-07 ENCOUNTER — Ambulatory Visit (INDEPENDENT_AMBULATORY_CARE_PROVIDER_SITE_OTHER): Payer: 59 | Admitting: Psychiatry

## 2020-05-07 ENCOUNTER — Encounter (HOSPITAL_COMMUNITY): Payer: Self-pay | Admitting: Psychiatry

## 2020-05-07 ENCOUNTER — Other Ambulatory Visit: Payer: Self-pay

## 2020-05-07 DIAGNOSIS — F331 Major depressive disorder, recurrent, moderate: Secondary | ICD-10-CM

## 2020-05-07 DIAGNOSIS — F411 Generalized anxiety disorder: Secondary | ICD-10-CM | POA: Diagnosis not present

## 2020-05-07 DIAGNOSIS — F900 Attention-deficit hyperactivity disorder, predominantly inattentive type: Secondary | ICD-10-CM | POA: Diagnosis not present

## 2020-05-07 DIAGNOSIS — F3341 Major depressive disorder, recurrent, in partial remission: Secondary | ICD-10-CM

## 2020-05-07 MED ORDER — HYDROXYZINE HCL 25 MG PO TABS: 25.0000 mg | ORAL_TABLET | Freq: Two times a day (BID) | ORAL | 1 refills | Status: DC | PRN

## 2020-05-07 MED ORDER — VENLAFAXINE HCL ER 75 MG PO CP24: 75.0000 mg | ORAL_CAPSULE | Freq: Every day | ORAL | 1 refills | Status: DC

## 2020-05-07 MED ORDER — ATOMOXETINE HCL 25 MG PO CAPS: 25.0000 mg | ORAL_CAPSULE | Freq: Every day | ORAL | 1 refills | Status: DC

## 2020-05-07 NOTE — Progress Notes (Signed)
BH MD/PA/NP OP Progress Note  05/07/2020 10:46 AM Tina Warren  MRN:  [id number]  Chief Complaint: " My depression is better but I am still feeling anxious."  HPI: Patient reported that after starting venlafaxine she has noticed improvement in her mood.  She reported that she is not as tearful and depressed that she used to be.  Her energy levels have improved.  She has started doing more things around the house.  However she has noticed that guanfacine has not made a big difference in her concentration.  She still feels distracted.  She also reported that she feels anxious on some occasions.  She informed that she had 2 specific episodes when she felt very anxious and had wished that she had something to take for as needed use. She has been sleeping fairly well.  Patient was offered option of trial of Strattera instead of guanfacine for optimal control of poor concentration and also anxiety.  She was also offered as needed hydroxyzine for breakthrough anxiety attacks.  Patient was agreeable with this recommendation. Potential side effects of medication and risks vs benefits of treatment vs non-treatment were explained and discussed. All questions were answered.    Visit Diagnosis:    ICD-10-CM   1. MDD (major depressive disorder), recurrent, in partial remission (HCC)  F33.41   2. Generalized anxiety disorder  F41.1   3. Attention deficit hyperactivity disorder (ADHD), predominantly inattentive type  F90.0     Past Psychiatric History: MDD, GAD, ADHD  Past Medical History: No past medical history on file.  Past Surgical History:  Procedure Laterality Date   TONSILLECTOMY      Family Psychiatric History: Mother-anxiety, depression.  41 year old daughter-depression, anxiety   Family History:  Family History  Family history unknown: Yes    Social History:  Social History   Socioeconomic History   Marital status: Married    Spouse name: Not on file   Number of children:  Not on file   Years of education: Not on file   Highest education level: Not on file  Occupational History   Not on file  Tobacco Use   Smoking status: Current Every Day Smoker    Packs/day: 0.25    Types: Cigarettes   Smokeless tobacco: Never Used  Vaping Use   Vaping Use: Never used  Substance and Sexual Activity   Alcohol use: Yes    Comment: rarely   Drug use: No   Sexual activity: Yes    Birth control/protection: Surgical  Other Topics Concern   Not on file  Social History Narrative   Not on file   Social Determinants of Health   Financial Resource Strain:    Difficulty of Paying Living Expenses:   Food Insecurity:    Worried About Programme researcher, broadcasting/film/video in the Last Year:    Barista in the Last Year:   Transportation Needs:    Freight forwarder (Medical):    Lack of Transportation (Non-Medical):   Physical Activity:    Days of Exercise per Week:    Minutes of Exercise per Session:   Stress:    Feeling of Stress :   Social Connections:    Frequency of Communication with Friends and Family:    Frequency of Social Gatherings with Friends and Family:    Attends Religious Services:    Active Member of Clubs or Organizations:    Attends Banker Meetings:    Marital Status:     Allergies:  Allergies  Allergen Reactions   Codeine Nausea Only and Nausea And Vomiting   Penicillins     Childhood Allergy Has patient had a PCN reaction causing immediate rash, facial/tongue/throat swelling, SOB or lightheadedness with hypotension: Unknown Has patient had a PCN reaction causing severe rash involving mucus membranes or skin necrosis: Unknown Has patient had a PCN reaction that required hospitalization: Unknown Has patient had a PCN reaction occurring within the last 10 years: Unknown If all of the above answers are "NO", then may proceed with Cephalosporin use.     Metabolic Disorder Labs: No results found for:  HGBA1C, MPG No results found for: PROLACTIN No results found for: CHOL, TRIG, HDL, CHOLHDL, VLDL, LDLCALC No results found for: TSH  Therapeutic Level Labs: No results found for: LITHIUM No results found for: VALPROATE No components found for:  CBMZ  Current Medications: Current Outpatient Medications  Medication Sig Dispense Refill   acetaminophen (TYLENOL) 500 MG tablet Take 1 tablet (500 mg total) by mouth every 6 (six) hours as needed. 30 tablet 0   Aspirin-Caffeine (BC FAST PAIN RELIEF PO) Take 1 packet by mouth daily as needed (pain).     cetirizine (ZYRTEC ALLERGY) 10 MG tablet Take 1 tablet (10 mg total) by mouth daily. 30 tablet 0   guanFACINE (INTUNIV) 1 MG TB24 ER tablet Take 1 tablet (1 mg total) by mouth 2 (two) times daily. 60 tablet 1   ondansetron (ZOFRAN ODT) 4 MG disintegrating tablet Take 1 tablet (4 mg total) by mouth every 8 (eight) hours as needed for nausea or vomiting. 20 tablet 0   venlafaxine XR (EFFEXOR XR) 75 MG 24 hr capsule Take 1 capsule (75 mg total) by mouth daily. 30 capsule 1   No current facility-administered medications for this visit.     Musculoskeletal: Strength & Muscle Tone: within normal limits Gait & Station: normal Patient leans: N/A  Psychiatric Specialty Exam: Review of Systems  There were no vitals taken for this visit.There is no height or weight on file to calculate BMI.  General Appearance: Fairly Groomed  Eye Contact:  Good  Speech:  Clear and Coherent and Normal Rate  Volume:  Normal  Mood:  Anxious  Affect:  Congruent  Thought Process:  Goal Directed and Descriptions of Associations: Intact  Orientation:  Full (Time, Place, and Person)  Thought Content: Logical   Suicidal Thoughts:  No  Homicidal Thoughts:  No  Memory:  Immediate;   Good Recent;   Good  Judgement:  Fair  Insight:  Fair  Psychomotor Activity:  Normal  Concentration:  Concentration: Good and Attention Span: Good  Recall:  Good  Fund of  Knowledge: Good  Language: Good  Akathisia:  Negative  Handed:  Right  AIMS (if indicated):not done  Assets:  Communication Skills Desire for Improvement Financial Resources/Insurance Housing Intimacy Social Support  ADL's:  Intact  Cognition: WNL  Sleep:  Good    Assessment and Plan: Patient appears to be doing better in terms of her depression symptoms however still reporting anxiety.  She also complained of poor concentration and did not find guanfacine to be helpful.  She was offered Strattera to help with poor concentration symptoms in addition to anxiety.  She was also prescribed as needed hydroxyzine to help with anxiety symptoms. Potential side effects of medication and risks vs benefits of treatment vs non-treatment were explained and discussed. All questions were answered.   1. MDD (major depressive disorder), recurrent, in partial remission (HCC) - venlafaxine  XR (EFFEXOR XR) 75 MG 24 hr capsule; Take 1 capsule (75 mg total) by mouth daily.  Dispense: 30 capsule; Refill: 1  2. Generalized anxiety disorder  - venlafaxine XR (EFFEXOR XR) 75 MG 24 hr capsule; Take 1 capsule (75 mg total) by mouth daily.  Dispense: 30 capsule; Refill: 1 - hydrOXYzine (ATARAX/VISTARIL) 25 MG tablet; Take 1 tablet (25 mg total) by mouth 2 (two) times daily as needed for anxiety.  Dispense: 30 tablet; Refill: 1  3. Attention deficit hyperactivity disorder (ADHD), predominantly inattentive type  - atomoxetine (STRATTERA) 25 MG capsule; Take 1 capsule (25 mg total) by mouth daily.  Dispense: 30 capsule; Refill: 1   Recommended to continue individual therapy. Follow-up in 6 weeks.   Zena Amos, MD 05/07/2020, 10:46 AM

## 2020-06-18 ENCOUNTER — Encounter (HOSPITAL_COMMUNITY): Payer: 59 | Admitting: Psychiatry

## 2020-06-18 ENCOUNTER — Ambulatory Visit (HOSPITAL_COMMUNITY): Payer: 59 | Admitting: Licensed Clinical Social Worker

## 2020-07-02 ENCOUNTER — Telehealth (HOSPITAL_COMMUNITY): Payer: Self-pay | Admitting: *Deleted

## 2020-07-02 NOTE — Telephone Encounter (Signed)
Called stating she is feeling much worse without any specific provocation. She reports she had been doing very well with her current regimen of Effexor and Strattera but recently she has become more depressed, low energy, anxious, and sad. She has a follow up appt with Dr Evelene Croon the end of sept. She feels she needs to be seen sooner for a med adjustment and Clinical research associate agrees. Transferred her call to the front desk to be offered walk in slots or to change her to a sooner scheduled appt if available.

## 2020-07-03 ENCOUNTER — Ambulatory Visit (INDEPENDENT_AMBULATORY_CARE_PROVIDER_SITE_OTHER): Payer: 59 | Admitting: Psychiatry

## 2020-07-03 ENCOUNTER — Encounter (HOSPITAL_COMMUNITY): Payer: Self-pay | Admitting: Psychiatry

## 2020-07-03 ENCOUNTER — Other Ambulatory Visit: Payer: Self-pay

## 2020-07-03 DIAGNOSIS — F900 Attention-deficit hyperactivity disorder, predominantly inattentive type: Secondary | ICD-10-CM | POA: Diagnosis not present

## 2020-07-03 DIAGNOSIS — F3341 Major depressive disorder, recurrent, in partial remission: Secondary | ICD-10-CM

## 2020-07-03 DIAGNOSIS — F411 Generalized anxiety disorder: Secondary | ICD-10-CM

## 2020-07-03 MED ORDER — ATOMOXETINE HCL 40 MG PO CAPS: 40.0000 mg | ORAL_CAPSULE | Freq: Every day | ORAL | 1 refills | Status: DC

## 2020-07-03 MED ORDER — HYDROXYZINE HCL 10 MG PO TABS: 10.0000 mg | ORAL_TABLET | Freq: Two times a day (BID) | ORAL | 1 refills | Status: DC | PRN

## 2020-07-03 MED ORDER — VENLAFAXINE HCL ER 150 MG PO CP24: 150.0000 mg | ORAL_CAPSULE | Freq: Every day | ORAL | 1 refills | Status: DC

## 2020-07-03 NOTE — Progress Notes (Signed)
BH MD/PA/NP OP Progress Note  07/03/2020 9:28 AM Tina Warren  MRN:  [id number]  Chief Complaint: " I am not doing well."  HPI: Pt had no-showed for her appointment with the writer and scheduled for August 10. Today, she presented as walk-in complaining of increased depression and anxiety symptoms.  She stated that she is feeling depressed all the time and feels like she has no energy to do anything.  She is having a hard time focusing at work and completing her tasks.  She has frequent crying spells.  She has some difficulty with sleep as well.  She stated that not being able to do her work is causing her to have significant anxiety.  She also feels that she cannot focus as well.  She asked for increases in the doses of Effexor as well as Strattera. Regarding hydroxyzine, she stated that she does not take it anymore as the dose is very strong and makes her very drowsy.  She was agreeable to trying a reduced dose of hydroxyzine for as needed use for anxiety.   Visit Diagnosis:    ICD-10-CM   1. MDD (major depressive disorder), recurrent, in partial remission (HCC)  F33.41 venlafaxine XR (EFFEXOR-XR) 150 MG 24 hr capsule  2. Generalized anxiety disorder  F41.1 venlafaxine XR (EFFEXOR-XR) 150 MG 24 hr capsule    hydrOXYzine (ATARAX/VISTARIL) 10 MG tablet  3. Attention deficit hyperactivity disorder (ADHD), predominantly inattentive type  F90.0 atomoxetine (STRATTERA) 40 MG capsule    Past Psychiatric History: MDD, GAD, ADHD  Past Medical History: No past medical history on file.  Past Surgical History:  Procedure Laterality Date  . TONSILLECTOMY      Family Psychiatric History: Mother-anxiety, depression.  5 year old daughter-depression, anxiety   Family History:  Family History  Family history unknown: Yes    Social History:  Social History   Socioeconomic History  . Marital status: Married    Spouse name: Not on file  . Number of children: Not on file  . Years of  education: Not on file  . Highest education level: Not on file  Occupational History  . Not on file  Tobacco Use  . Smoking status: Current Every Day Smoker    Packs/day: 0.25    Types: Cigarettes  . Smokeless tobacco: Never Used  Vaping Use  . Vaping Use: Never used  Substance and Sexual Activity  . Alcohol use: Yes    Comment: rarely  . Drug use: No  . Sexual activity: Yes    Birth control/protection: Surgical  Other Topics Concern  . Not on file  Social History Narrative  . Not on file   Social Determinants of Health   Financial Resource Strain:   . Difficulty of Paying Living Expenses: Not on file  Food Insecurity:   . Worried About Programme researcher, broadcasting/film/video in the Last Year: Not on file  . Ran Out of Food in the Last Year: Not on file  Transportation Needs:   . Lack of Transportation (Medical): Not on file  . Lack of Transportation (Non-Medical): Not on file  Physical Activity:   . Days of Exercise per Week: Not on file  . Minutes of Exercise per Session: Not on file  Stress:   . Feeling of Stress : Not on file  Social Connections:   . Frequency of Communication with Friends and Family: Not on file  . Frequency of Social Gatherings with Friends and Family: Not on file  . Attends Religious Services: Not on  file  . Active Member of Clubs or Organizations: Not on file  . Attends Banker Meetings: Not on file  . Marital Status: Not on file    Allergies:  Allergies  Allergen Reactions  . Codeine Nausea Only and Nausea And Vomiting  . Penicillins     Childhood Allergy Has patient had a PCN reaction causing immediate rash, facial/tongue/throat swelling, SOB or lightheadedness with hypotension: Unknown Has patient had a PCN reaction causing severe rash involving mucus membranes or skin necrosis: Unknown Has patient had a PCN reaction that required hospitalization: Unknown Has patient had a PCN reaction occurring within the last 10 years: Unknown If all of  the above answers are "NO", then may proceed with Cephalosporin use.     Metabolic Disorder Labs: No results found for: HGBA1C, MPG No results found for: PROLACTIN No results found for: CHOL, TRIG, HDL, CHOLHDL, VLDL, LDLCALC No results found for: TSH  Therapeutic Level Labs: No results found for: LITHIUM No results found for: VALPROATE No components found for:  CBMZ  Current Medications: Current Outpatient Medications  Medication Sig Dispense Refill  . acetaminophen (TYLENOL) 500 MG tablet Take 1 tablet (500 mg total) by mouth every 6 (six) hours as needed. 30 tablet 0  . Aspirin-Caffeine (BC FAST PAIN RELIEF PO) Take 1 packet by mouth daily as needed (pain).    Marland Kitchen atomoxetine (STRATTERA) 40 MG capsule Take 1 capsule (40 mg total) by mouth daily. 30 capsule 1  . cetirizine (ZYRTEC ALLERGY) 10 MG tablet Take 1 tablet (10 mg total) by mouth daily. 30 tablet 0  . hydrOXYzine (ATARAX/VISTARIL) 10 MG tablet Take 1 tablet (10 mg total) by mouth 2 (two) times daily as needed for anxiety. 30 tablet 1  . ondansetron (ZOFRAN ODT) 4 MG disintegrating tablet Take 1 tablet (4 mg total) by mouth every 8 (eight) hours as needed for nausea or vomiting. 20 tablet 0  . venlafaxine XR (EFFEXOR-XR) 150 MG 24 hr capsule Take 1 capsule (150 mg total) by mouth daily with breakfast. 30 capsule 1   No current facility-administered medications for this visit.     Musculoskeletal: Strength & Muscle Tone: within normal limits Gait & Station: normal Patient leans: N/A  Psychiatric Specialty Exam: Review of Systems  There were no vitals taken for this visit.There is no height or weight on file to calculate BMI.  General Appearance: Fairly Groomed  Eye Contact:  Good  Speech:  Clear and Coherent and Normal Rate  Volume:  Normal  Mood:  Anxious  Affect:  Congruent  Thought Process:  Goal Directed and Descriptions of Associations: Intact  Orientation:  Full (Time, Place, and Person)  Thought Content:  Logical   Suicidal Thoughts:  No  Homicidal Thoughts:  No  Memory:  Immediate;   Good Recent;   Good  Judgement:  Fair  Insight:  Fair  Psychomotor Activity:  Normal  Concentration:  Concentration: Good and Attention Span: Good  Recall:  Good  Fund of Knowledge: Good  Language: Good  Akathisia:  Negative  Handed:  Right  AIMS (if indicated):not done  Assets:  Communication Skills Desire for Improvement Financial Resources/Insurance Housing Intimacy Social Support  ADL's:  Intact  Cognition: WNL  Sleep:  Good    Assessment and Plan: Pt seen as walk-in after she presented with complains of worsening depression anxiety symptoms.  Her dose of medications is being increased for optimal effect and dose of hydroxyzine was reduced due to excessive drowsiness on higher dose.  Patient was encouraged to keep her next appointment.   1. MDD (major depressive disorder), recurrent, in partial remission (HCC)  -Increase venlafaxine XR (EFFEXOR-XR) 150 MG 24 hr capsule; Take 1 capsule (150 mg total) by mouth daily with breakfast.  Dispense: 30 capsule; Refill: 1  2. Generalized anxiety disorder  -Increase venlafaxine XR (EFFEXOR-XR) 150 MG 24 hr capsule; Take 1 capsule (150 mg total) by mouth daily with breakfast.  Dispense: 30 capsule; Refill: 1 -Reduce hydrOXYzine (ATARAX/VISTARIL) 10 MG tablet; Take 1 tablet (10 mg total) by mouth 2 (two) times daily as needed for anxiety.  Dispense: 30 tablet; Refill: 1  3. Attention deficit hyperactivity disorder (ADHD), predominantly inattentive type  -Increase atomoxetine (STRATTERA) 40 MG capsule; Take 1 capsule (40 mg total) by mouth daily.  Dispense: 30 capsule; Refill: 1    Follow-up in 6 weeks.   Zena Amos, MD 07/03/2020, 9:28 AM

## 2020-08-06 ENCOUNTER — Ambulatory Visit (HOSPITAL_COMMUNITY): Payer: 59 | Admitting: Psychiatry

## 2020-08-14 ENCOUNTER — Encounter (HOSPITAL_COMMUNITY): Payer: Self-pay | Admitting: Psychiatry

## 2020-08-14 ENCOUNTER — Ambulatory Visit (INDEPENDENT_AMBULATORY_CARE_PROVIDER_SITE_OTHER): Payer: 59 | Admitting: Psychiatry

## 2020-08-14 ENCOUNTER — Other Ambulatory Visit: Payer: Self-pay

## 2020-08-14 VITALS — BP 143/80 | HR 98 | Ht 65.0 in | Wt 226.0 lb

## 2020-08-14 DIAGNOSIS — F3341 Major depressive disorder, recurrent, in partial remission: Secondary | ICD-10-CM | POA: Diagnosis not present

## 2020-08-14 DIAGNOSIS — F900 Attention-deficit hyperactivity disorder, predominantly inattentive type: Secondary | ICD-10-CM

## 2020-08-14 DIAGNOSIS — F411 Generalized anxiety disorder: Secondary | ICD-10-CM | POA: Diagnosis not present

## 2020-08-14 MED ORDER — ATOMOXETINE HCL 60 MG PO CAPS: 60.0000 mg | ORAL_CAPSULE | Freq: Every day | ORAL | 1 refills | Status: DC

## 2020-08-14 MED ORDER — HYDROXYZINE HCL 10 MG PO TABS: 10.0000 mg | ORAL_TABLET | Freq: Two times a day (BID) | ORAL | 1 refills | Status: DC | PRN

## 2020-08-14 MED ORDER — VENLAFAXINE HCL ER 150 MG PO CP24: 150.0000 mg | ORAL_CAPSULE | Freq: Every day | ORAL | 1 refills | Status: DC

## 2020-08-14 MED ORDER — GUANFACINE HCL ER 1 MG PO TB24: 1.0000 mg | ORAL_TABLET | Freq: Two times a day (BID) | ORAL | 1 refills | Status: DC

## 2020-08-14 NOTE — Progress Notes (Signed)
BH MD/PA/NP OP Progress Note  08/14/2020 3:30 PM Tina Warren  MRN:  [id number]  Chief Complaint: " I am doing better."  HPI: Patient reported that she is doing much better after her medicine doses were adjusted at the time of her last visit.  She stated taking a lower dose of hydroxyzine was helpful and she is not as drowsy as she was before.  She reported that Strattera 40 mg dose has been quite helpful and she does notice improvement in her attention.  However she still feels that she can focus better and therefore she asked if she can restart taking guanfacine 1 mg twice daily as she is taken in the past and found it to be very helpful for her attention span issues.  She stated that she would also like to continue the Strattera and asked if she could take a higher dose as it does help her with her energy levels too. She informed that she has recently started a new job and is liking that.  She said that she is low on money so will wait for about 2 weeks before she fills her prescriptions she has enough medicines till then. She denied any other concerns at this time.  Visit Diagnosis:    ICD-10-CM   1. Attention deficit hyperactivity disorder (ADHD), predominantly inattentive type  F90.0   2. Generalized anxiety disorder  F41.1   3. MDD (major depressive disorder), recurrent, in partial remission (HCC)  F33.41     Past Psychiatric History: MDD, GAD, ADHD  Past Medical History: No past medical history on file.  Past Surgical History:  Procedure Laterality Date  . TONSILLECTOMY      Family Psychiatric History: Mother-anxiety, depression.  72 year old daughter-depression, anxiety   Family History:  Family History  Family history unknown: Yes    Social History:  Social History   Socioeconomic History  . Marital status: Married    Spouse name: Not on file  . Number of children: Not on file  . Years of education: Not on file  . Highest education level: Not on file   Occupational History  . Not on file  Tobacco Use  . Smoking status: Current Every Day Smoker    Packs/day: 0.25    Types: Cigarettes  . Smokeless tobacco: Never Used  Vaping Use  . Vaping Use: Never used  Substance and Sexual Activity  . Alcohol use: Yes    Comment: rarely  . Drug use: No  . Sexual activity: Yes    Birth control/protection: Surgical  Other Topics Concern  . Not on file  Social History Narrative  . Not on file   Social Determinants of Health   Financial Resource Strain:   . Difficulty of Paying Living Expenses: Not on file  Food Insecurity:   . Worried About Programme researcher, broadcasting/film/video in the Last Year: Not on file  . Ran Out of Food in the Last Year: Not on file  Transportation Needs:   . Lack of Transportation (Medical): Not on file  . Lack of Transportation (Non-Medical): Not on file  Physical Activity:   . Days of Exercise per Week: Not on file  . Minutes of Exercise per Session: Not on file  Stress:   . Feeling of Stress : Not on file  Social Connections:   . Frequency of Communication with Friends and Family: Not on file  . Frequency of Social Gatherings with Friends and Family: Not on file  . Attends Religious Services: Not on  file  . Active Member of Clubs or Organizations: Not on file  . Attends Banker Meetings: Not on file  . Marital Status: Not on file    Allergies:  Allergies  Allergen Reactions  . Codeine Nausea Only and Nausea And Vomiting  . Penicillins     Childhood Allergy Has patient had a PCN reaction causing immediate rash, facial/tongue/throat swelling, SOB or lightheadedness with hypotension: Unknown Has patient had a PCN reaction causing severe rash involving mucus membranes or skin necrosis: Unknown Has patient had a PCN reaction that required hospitalization: Unknown Has patient had a PCN reaction occurring within the last 10 years: Unknown If all of the above answers are "NO", then may proceed with Cephalosporin  use.     Metabolic Disorder Labs: No results found for: HGBA1C, MPG No results found for: PROLACTIN No results found for: CHOL, TRIG, HDL, CHOLHDL, VLDL, LDLCALC No results found for: TSH  Therapeutic Level Labs: No results found for: LITHIUM No results found for: VALPROATE No components found for:  CBMZ  Current Medications: Current Outpatient Medications  Medication Sig Dispense Refill  . acetaminophen (TYLENOL) 500 MG tablet Take 1 tablet (500 mg total) by mouth every 6 (six) hours as needed. 30 tablet 0  . Aspirin-Caffeine (BC FAST PAIN RELIEF PO) Take 1 packet by mouth daily as needed (pain).    . cetirizine (ZYRTEC ALLERGY) 10 MG tablet Take 1 tablet (10 mg total) by mouth daily. 30 tablet 0  . hydrOXYzine (ATARAX/VISTARIL) 10 MG tablet Take 1 tablet (10 mg total) by mouth 2 (two) times daily as needed for anxiety. 30 tablet 1  . ondansetron (ZOFRAN ODT) 4 MG disintegrating tablet Take 1 tablet (4 mg total) by mouth every 8 (eight) hours as needed for nausea or vomiting. 20 tablet 0  . venlafaxine XR (EFFEXOR-XR) 150 MG 24 hr capsule Take 1 capsule (150 mg total) by mouth daily with breakfast. 30 capsule 1   No current facility-administered medications for this visit.     Musculoskeletal: Strength & Muscle Tone: within normal limits Gait & Station: normal Patient leans: N/A  Psychiatric Specialty Exam: Review of Systems    There were no vitals taken for this visit.There is no height or weight on file to calculate BMI.  General Appearance: Fairly Groomed  Eye Contact:  Good  Speech:  Clear and Coherent and Normal Rate  Volume:  Normal  Mood:  Euthymic  Affect:  Congruent  Thought Process:  Goal Directed and Descriptions of Associations: Intact  Orientation:  Full (Time, Place, and Person)  Thought Content: Logical   Suicidal Thoughts:  No  Homicidal Thoughts:  No  Memory:  Immediate;   Good Recent;   Good  Judgement:  Fair  Insight:  Fair  Psychomotor  Activity:  Normal  Concentration:  Concentration: Good and Attention Span: Good  Recall:  Good  Fund of Knowledge: Good  Language: Good  Akathisia:  Negative  Handed:  Right  AIMS (if indicated):not done  Assets:  Communication Skills Desire for Improvement Financial Resources/Insurance Housing Intimacy Social Support  ADL's:  Intact  Cognition: WNL  Sleep:  Good    Assessment and Plan: Pt reported improvement in her anxiety symptoms after her medicine doses were adjusted at the time of her last visit.  She requested higher dose of Strattera for optimal control of attention issues and also requested if she can restart taking Intuniv 1 mg twice daily as she used to take that in the past  and found to be very helpful. Potential side effects of medication and risks vs benefits of treatment vs non-treatment were explained and discussed. All questions were answered.   VS: Blood pressure (!) 143/80, pulse 98, height 5\' 5"  (1.651 m), weight 226 lb (102.5 kg), SpO2 97 %.   1. Generalized anxiety disorder  - hydrOXYzine (ATARAX/VISTARIL) 10 MG tablet; Take 1 tablet (10 mg total) by mouth 2 (two) times daily as needed for anxiety.  Dispense: 30 tablet; Refill: 1 - venlafaxine XR (EFFEXOR-XR) 150 MG 24 hr capsule; Take 1 capsule (150 mg total) by mouth daily with breakfast.  Dispense: 30 capsule; Refill: 1  2. MDD (major depressive disorder), recurrent, in partial remission (HCC)  - venlafaxine XR (EFFEXOR-XR) 150 MG 24 hr capsule; Take 1 capsule (150 mg total) by mouth daily with breakfast.  Dispense: 30 capsule; Refill: 1  3. Attention deficit hyperactivity disorder (ADHD), predominantly inattentive type  - Increaseatomoxetine (STRATTERA) 60 MG capsule; Take 1 capsule (60 mg total) by mouth daily.  Dispense: 30 capsule; Refill: 1 - Restart guanFACINE (INTUNIV) 1 MG TB24 ER tablet; Take 1 tablet (1 mg total) by mouth in the morning and at bedtime.  Dispense: 60 tablet; Refill: 1  Continue  individual therapy. Follow-up in 2 months.   , MD 08/14/2020, 3:30 PM

## 2020-10-09 ENCOUNTER — Ambulatory Visit (INDEPENDENT_AMBULATORY_CARE_PROVIDER_SITE_OTHER): Payer: 59 | Admitting: Psychiatry

## 2020-10-09 ENCOUNTER — Encounter (HOSPITAL_COMMUNITY): Payer: Self-pay | Admitting: Psychiatry

## 2020-10-09 ENCOUNTER — Other Ambulatory Visit: Payer: Self-pay

## 2020-10-09 DIAGNOSIS — F3341 Major depressive disorder, recurrent, in partial remission: Secondary | ICD-10-CM

## 2020-10-09 DIAGNOSIS — F411 Generalized anxiety disorder: Secondary | ICD-10-CM | POA: Diagnosis not present

## 2020-10-09 DIAGNOSIS — F900 Attention-deficit hyperactivity disorder, predominantly inattentive type: Secondary | ICD-10-CM

## 2020-10-09 MED ORDER — ATOMOXETINE HCL 60 MG PO CAPS: 60.0000 mg | ORAL_CAPSULE | Freq: Every day | ORAL | 1 refills | Status: DC

## 2020-10-09 MED ORDER — CLONAZEPAM 0.5 MG PO TABS: 0.5000 mg | ORAL_TABLET | Freq: Every day | ORAL | 1 refills | Status: DC | PRN

## 2020-10-09 MED ORDER — HYDROXYZINE HCL 10 MG PO TABS: 10.0000 mg | ORAL_TABLET | Freq: Two times a day (BID) | ORAL | 1 refills | Status: DC | PRN

## 2020-10-09 MED ORDER — VENLAFAXINE HCL ER 75 MG PO CP24: ORAL_CAPSULE | ORAL | 1 refills | Status: DC

## 2020-10-09 NOTE — Progress Notes (Signed)
BH MD/PA/NP OP Progress Note  10/09/2020 3:43 PM Tina Warren  MRN:  [id number]  Chief Complaint: " ."  HPI: Patient was restarted on guanfacine for ADHD symptoms at the time of her last visit and her dose of Strattera was increased to 60 mg. Today, she reports that she believes that she she has realized that she has always had difficulty in focusing because she is always fixated on her dating relationship.  She endorses increased anxiety symptoms including excessively worrying, nervousness, and ruminating obsessive thoughts.  She reports that she constantly has thoughts that her partner is cheating on her if they don't pick up the phone when she calls, she repetitively checks their social media page or friends list, and she always feels like something will go wrong in their relationship.  She also reports that these intrusive thoughts cause her to experience a lot of self-doubt about being good enough for her partner and thinking negatively about her body image.    She also endorses experiencing depressed mood, low energy, and anhedonia some days.  However, she reports that her Effexor medication helps to reduce her depressive symptoms. She denies any SI, HI, or hallucinations.     Patient is worried about these constant obsessive thoughts pertaining to her relationships.  She asked if she can be prescribed some medication that can help with these intrusive and obsessive thoughts.  She was agreeable to increasing the dose of his Effexor to see if that would help.  She stated that it will be helpful if she can have something for as needed anxiety because she does not think hydroxyzine helps her with anything.  She stated that she sometimes takes it at bedtime to help her with sleep. She stated she does not want to be addicted to any benzos however just a few tablets of something to relieve anxiety would be helpful.  She was agreeable to short-term course of clonazepam to help with breakthrough  anxiety. Patient also verbalized that she has received EMDR in the past and that was very helpful with these obsessive and intrusive thoughts.  She does not have any insurance currently and she is looking into options for therapists that can offer EMDR. Writer asked if she would like to continue taking guanfacine with the Strattera and she replied she does not really think she needs it.    Visit Diagnosis:    ICD-10-CM   1. MDD (major depressive disorder), recurrent, in partial remission (HCC)  F33.41 hydrOXYzine (ATARAX/VISTARIL) 10 MG tablet    venlafaxine XR (EFFEXOR XR) 75 MG 24 hr capsule  2. Generalized anxiety disorder  F41.1 hydrOXYzine (ATARAX/VISTARIL) 10 MG tablet    clonazePAM (KLONOPIN) 0.5 MG tablet  3. Attention deficit hyperactivity disorder (ADHD), predominantly inattentive type  F90.0 atomoxetine (STRATTERA) 60 MG capsule    Past Psychiatric History: MDD, GAD, ADHD  Past Medical History: No past medical history on file.  Past Surgical History:  Procedure Laterality Date  . TONSILLECTOMY      Family Psychiatric History: Mother-anxiety, depression.  27 year old daughter-depression, anxiety   Family History:  Family History  Family history unknown: Yes    Social History:  Social History   Socioeconomic History  . Marital status: Married    Spouse name: Not on file  . Number of children: Not on file  . Years of education: Not on file  . Highest education level: Not on file  Occupational History  . Not on file  Tobacco Use  . Smoking status: Current  Every Day Smoker    Packs/day: 0.25    Types: Cigarettes  . Smokeless tobacco: Never Used  Vaping Use  . Vaping Use: Never used  Substance and Sexual Activity  . Alcohol use: Yes    Comment: rarely  . Drug use: No  . Sexual activity: Yes    Birth control/protection: Surgical  Other Topics Concern  . Not on file  Social History Narrative  . Not on file   Social Determinants of Health   Financial  Resource Strain:   . Difficulty of Paying Living Expenses: Not on file  Food Insecurity:   . Worried About Programme researcher, broadcasting/film/video in the Last Year: Not on file  . Ran Out of Food in the Last Year: Not on file  Transportation Needs:   . Lack of Transportation (Medical): Not on file  . Lack of Transportation (Non-Medical): Not on file  Physical Activity:   . Days of Exercise per Week: Not on file  . Minutes of Exercise per Session: Not on file  Stress:   . Feeling of Stress : Not on file  Social Connections:   . Frequency of Communication with Friends and Family: Not on file  . Frequency of Social Gatherings with Friends and Family: Not on file  . Attends Religious Services: Not on file  . Active Member of Clubs or Organizations: Not on file  . Attends Banker Meetings: Not on file  . Marital Status: Not on file    Allergies:  Allergies  Allergen Reactions  . Codeine Nausea Only and Nausea And Vomiting  . Penicillins     Childhood Allergy Has patient had a PCN reaction causing immediate rash, facial/tongue/throat swelling, SOB or lightheadedness with hypotension: Unknown Has patient had a PCN reaction causing severe rash involving mucus membranes or skin necrosis: Unknown Has patient had a PCN reaction that required hospitalization: Unknown Has patient had a PCN reaction occurring within the last 10 years: Unknown If all of the above answers are "NO", then may proceed with Cephalosporin use.     Metabolic Disorder Labs: No results found for: HGBA1C, MPG No results found for: PROLACTIN No results found for: CHOL, TRIG, HDL, CHOLHDL, VLDL, LDLCALC No results found for: TSH  Therapeutic Level Labs: No results found for: LITHIUM No results found for: VALPROATE No components found for:  CBMZ  Current Medications: Current Outpatient Medications  Medication Sig Dispense Refill  . acetaminophen (TYLENOL) 500 MG tablet Take 1 tablet (500 mg total) by mouth every 6  (six) hours as needed. 30 tablet 0  . Aspirin-Caffeine (BC FAST PAIN RELIEF PO) Take 1 packet by mouth daily as needed (pain).    Marland Kitchen atomoxetine (STRATTERA) 60 MG capsule Take 1 capsule (60 mg total) by mouth daily. 30 capsule 1  . cetirizine (ZYRTEC ALLERGY) 10 MG tablet Take 1 tablet (10 mg total) by mouth daily. 30 tablet 0  . clonazePAM (KLONOPIN) 0.5 MG tablet Take 1 tablet (0.5 mg total) by mouth daily as needed for anxiety. 15 tablet 1  . guanFACINE (INTUNIV) 1 MG TB24 ER tablet Take 1 tablet (1 mg total) by mouth in the morning and at bedtime. 60 tablet 1  . hydrOXYzine (ATARAX/VISTARIL) 10 MG tablet Take 1 tablet (10 mg total) by mouth 2 (two) times daily as needed for anxiety. 30 tablet 1  . ondansetron (ZOFRAN ODT) 4 MG disintegrating tablet Take 1 tablet (4 mg total) by mouth every 8 (eight) hours as needed for nausea or  vomiting. (Patient not taking: Reported on 08/14/2020) 20 tablet 0  . venlafaxine XR (EFFEXOR XR) 75 MG 24 hr capsule Take 3 capsules daily 90 capsule 1   No current facility-administered medications for this visit.     Musculoskeletal: Strength & Muscle Tone: within normal limits Gait & Station: normal Patient leans: N/A  Psychiatric Specialty Exam: Review of Systems    There were no vitals taken for this visit.There is no height or weight on file to calculate BMI.  General Appearance: Fairly Groomed  Eye Contact:  Good  Speech:  Clear and Coherent and Normal Rate  Volume:  Normal  Mood:  Anxious and Depressed  Affect:  Congruent  Thought Process:  Goal Directed and Descriptions of Associations: Intact  Orientation:  Full (Time, Place, and Person)  Thought Content: Logical   Suicidal Thoughts:  No  Homicidal Thoughts:  No  Memory:  Immediate;   Good Recent;   Good  Judgement:  Fair  Insight:  Fair  Psychomotor Activity:  Normal  Concentration:  Concentration: Good and Attention Span: Good  Recall:  Good  Fund of Knowledge: Good  Language: Good   Akathisia:  Negative  Handed:  Right  AIMS (if indicated):not done  Assets:  Communication Skills Desire for Improvement Financial Resources/Insurance Housing Intimacy Social Support  ADL's:  Intact  Cognition: WNL  Sleep:  Fair    Assessment and Plan: Patient presents for follow-up today with complaints of obsessive thoughts and depressive symptoms.  She is agreeable to increasing the dose of Effexor to see if that would help her with the suppressive thoughts and depressive symptoms.  She was offered short-term course of as needed clonazepam for breakthrough anxiety.  Recommend to continue the Strattera for ADHD symptoms. Potential side effects of medication and risks vs benefits of treatment vs non-treatment were explained and discussed. All questions were answered.   1. MDD (major depressive disorder), recurrent, in partial remission (HCC)  - hydrOXYzine (ATARAX/VISTARIL) 10 MG tablet; Take 1 tablet (10 mg total) by mouth 2 (two) times daily as needed for anxiety.  Dispense: 30 tablet; Refill: 1 - Increase venlafaxine XR (EFFEXOR XR) 75 MG 24 hr capsule; Take 3 capsules daily  Dispense: 90 capsule; Refill: 1  2. Generalized anxiety disorder - hydrOXYzine (ATARAX/VISTARIL) 10 MG tablet; Take 1 tablet (10 mg total) by mouth 2 (two) times daily as needed for anxiety.  Dispense: 30 tablet; Refill: 1 - Start clonazePAM (KLONOPIN) 0.5 MG tablet; Take 1 tablet (0.5 mg total) by mouth daily as needed for anxiety.  Dispense: 15 tablet; Refill: 1  3. Attention deficit hyperactivity disorder (ADHD), predominantly inattentive type  - atomoxetine (STRATTERA) 60 MG capsule; Take 1 capsule (60 mg total) by mouth daily.  Dispense: 30 capsule; Refill: 1   Patient is looking for a therapist who offers EMDR as that has been helpful for her in the past. Follow-up in 2 months.   Zena Amos, MD 10/09/2020, 3:43 PM

## 2020-11-11 ENCOUNTER — Telehealth (HOSPITAL_COMMUNITY): Payer: Self-pay | Admitting: *Deleted

## 2020-11-11 DIAGNOSIS — F411 Generalized anxiety disorder: Secondary | ICD-10-CM

## 2020-11-11 MED ORDER — CLONAZEPAM 0.5 MG PO TABS: 0.5000 mg | ORAL_TABLET | Freq: Every day | ORAL | 0 refills | Status: DC | PRN

## 2020-11-11 NOTE — Telephone Encounter (Signed)
Rx sent 

## 2020-11-26 ENCOUNTER — Encounter (HOSPITAL_COMMUNITY): Payer: Self-pay

## 2020-11-27 ENCOUNTER — Other Ambulatory Visit: Payer: Self-pay

## 2020-11-27 ENCOUNTER — Telehealth (HOSPITAL_COMMUNITY): Payer: Self-pay | Admitting: Psychiatry

## 2020-11-27 NOTE — Telephone Encounter (Signed)
No response to the virtual appt links and also to phone calls. Call going to VM.

## 2020-11-28 ENCOUNTER — Telehealth (HOSPITAL_COMMUNITY): Payer: Self-pay | Admitting: *Deleted

## 2020-11-28 NOTE — Telephone Encounter (Signed)
[facility] pharmacy sent over a request for patients Clonazepam to be renewed. She should have been out of her medicine on 11/26/20. Will bring to Dr Magdalen Spatz attention.

## 2020-11-28 NOTE — Telephone Encounter (Signed)
She no showed for her appt yesterday. I sent several link and phone calls but there was no response.  She needs to be seen for refills.

## 2020-11-29 ENCOUNTER — Other Ambulatory Visit: Payer: Self-pay

## 2020-11-29 ENCOUNTER — Encounter (HOSPITAL_COMMUNITY): Payer: Self-pay | Admitting: Psychiatry

## 2020-11-29 ENCOUNTER — Telehealth (INDEPENDENT_AMBULATORY_CARE_PROVIDER_SITE_OTHER): Payer: No Payment, Other | Admitting: Psychiatry

## 2020-11-29 DIAGNOSIS — F411 Generalized anxiety disorder: Secondary | ICD-10-CM

## 2020-11-29 DIAGNOSIS — F900 Attention-deficit hyperactivity disorder, predominantly inattentive type: Secondary | ICD-10-CM | POA: Diagnosis not present

## 2020-11-29 DIAGNOSIS — F3341 Major depressive disorder, recurrent, in partial remission: Secondary | ICD-10-CM | POA: Diagnosis not present

## 2020-11-29 MED ORDER — ATOMOXETINE HCL 60 MG PO CAPS: 60.0000 mg | ORAL_CAPSULE | Freq: Every day | ORAL | 2 refills | Status: DC

## 2020-11-29 MED ORDER — HYDROXYZINE HCL 10 MG PO TABS: 10.0000 mg | ORAL_TABLET | Freq: Two times a day (BID) | ORAL | 2 refills | Status: DC | PRN

## 2020-11-29 MED ORDER — CLONAZEPAM 0.5 MG PO TABS: 0.5000 mg | ORAL_TABLET | Freq: Every day | ORAL | 2 refills | Status: DC | PRN

## 2020-11-29 MED ORDER — VENLAFAXINE HCL ER 75 MG PO CP24
ORAL_CAPSULE | ORAL | 2 refills | Status: DC
Start: 2020-11-29 — End: 2021-03-04

## 2020-11-29 NOTE — Progress Notes (Signed)
BH MD/PA/NP OP Progress Note  Virtual Visit via Telephone Note  I connected with Tina Warren on 11/29/20 at  9:10 AM EST by telephone and verified that I am speaking with the correct person using two identifiers.  Location: Patient: home Provider: home office   I discussed the limitations, risks, security and privacy concerns of performing an evaluation and management service by telephone and the availability of in person appointments. I also discussed with the patient that there may be a patient responsible charge related to this service. The patient expressed understanding and agreed to proceed.   I provided 15 minutes of non-face-to-face time during this encounter.    11/29/2020 9:12 AM Tina Warren  MRN:  [id number]  Chief Complaint: " I feel better."  HPI: Pt reported that she is doing better than before. She is not having frequent intrusive thoughts anymore. She stated that her anxiety is very well controlled on the das she takes clonazepam in the morning.She tries to take once in 2 or 3 days. She has noted that after she has gone siwth out clonazepam for more than 2 days in a row her anxiety returns back. She is not too keen on taking a benzo medicine on a daily medicine so tries her best to avoid taking it. She was receptive to the advice of taking it every other day. She asked if her dose of Effexor can be increased any further, writer explained to her that she was currently on the highest recommended dose. She informed she has not needed to take Hydroxyzine in the last few weeks, earlier she was using it at bedtime to fall asleep but lately her sleep has improved and she does not need to take Hydroxyzine in the evening anymore.    Visit Diagnosis:    ICD-10-CM   1. MDD (major depressive disorder), recurrent, in partial remission (HCC)  F33.41   2. Generalized anxiety disorder  F41.1   3. Attention deficit hyperactivity disorder (ADHD), predominantly inattentive type  F90.0      Past Psychiatric History: MDD, GAD, ADHD  Past Medical History: No past medical history on file.  Past Surgical History:  Procedure Laterality Date   TONSILLECTOMY      Family Psychiatric History: Mother-anxiety, depression.  110 year old daughter-depression, anxiety   Family History:  Family History  Family history unknown: Yes    Social History:  Social History   Socioeconomic History   Marital status: Married    Spouse name: Not on file   Number of children: Not on file   Years of education: Not on file   Highest education level: Not on file  Occupational History   Not on file  Tobacco Use   Smoking status: Current Every Day Smoker    Packs/day: 0.25    Types: Cigarettes   Smokeless tobacco: Never Used  Vaping Use   Vaping Use: Never used  Substance and Sexual Activity   Alcohol use: Yes    Comment: rarely   Drug use: No   Sexual activity: Yes    Birth control/protection: Surgical  Other Topics Concern   Not on file  Social History Narrative   Not on file   Social Determinants of Health   Financial Resource Strain: Not on file  Food Insecurity: Not on file  Transportation Needs: Not on file  Physical Activity: Not on file  Stress: Not on file  Social Connections: Not on file    Allergies:  Allergies  Allergen Reactions   Codeine Nausea  Only and Nausea And Vomiting   Penicillins     Childhood Allergy Has patient had a PCN reaction causing immediate rash, facial/tongue/throat swelling, SOB or lightheadedness with hypotension: Unknown Has patient had a PCN reaction causing severe rash involving mucus membranes or skin necrosis: Unknown Has patient had a PCN reaction that required hospitalization: Unknown Has patient had a PCN reaction occurring within the last 10 years: Unknown If all of the above answers are "NO", then may proceed with Cephalosporin use.     Metabolic Disorder Labs: No results found for: HGBA1C, MPG No  results found for: PROLACTIN No results found for: CHOL, TRIG, HDL, CHOLHDL, VLDL, LDLCALC No results found for: TSH  Therapeutic Level Labs: No results found for: LITHIUM No results found for: VALPROATE No components found for:  CBMZ  Current Medications: Current Outpatient Medications  Medication Sig Dispense Refill   acetaminophen (TYLENOL) 500 MG tablet Take 1 tablet (500 mg total) by mouth every 6 (six) hours as needed. 30 tablet 0   Aspirin-Caffeine (BC FAST PAIN RELIEF PO) Take 1 packet by mouth daily as needed (pain).     atomoxetine (STRATTERA) 60 MG capsule Take 1 capsule (60 mg total) by mouth daily. 30 capsule 1   cetirizine (ZYRTEC ALLERGY) 10 MG tablet Take 1 tablet (10 mg total) by mouth daily. 30 tablet 0   clonazePAM (KLONOPIN) 0.5 MG tablet Take 1 tablet (0.5 mg total) by mouth daily as needed for anxiety. 15 tablet 0   guanFACINE (INTUNIV) 1 MG TB24 ER tablet Take 1 tablet (1 mg total) by mouth in the morning and at bedtime. 60 tablet 1   hydrOXYzine (ATARAX/VISTARIL) 10 MG tablet Take 1 tablet (10 mg total) by mouth 2 (two) times daily as needed for anxiety. 30 tablet 1   ondansetron (ZOFRAN ODT) 4 MG disintegrating tablet Take 1 tablet (4 mg total) by mouth every 8 (eight) hours as needed for nausea or vomiting. (Patient not taking: Reported on 08/14/2020) 20 tablet 0   venlafaxine XR (EFFEXOR XR) 75 MG 24 hr capsule Take 3 capsules daily 90 capsule 1   No current facility-administered medications for this visit.     Musculoskeletal: Strength & Muscle Tone: within normal limits Gait & Station: normal Patient leans: N/A  Psychiatric Specialty Exam: Review of Systems    There were no vitals taken for this visit.There is no height or weight on file to calculate BMI.  General Appearance: unable to assess due to phone visit  Eye Contact:  unable to assess due to phone visit  Speech:  Clear and Coherent and Normal Rate  Volume:  Normal  Mood:  Euthymic   Affect:  Congruent  Thought Process:  Goal Directed and Descriptions of Associations: Intact  Orientation:  Full (Time, Place, and Person)  Thought Content: Logical   Suicidal Thoughts:  No  Homicidal Thoughts:  No  Memory:  Immediate;   Good Recent;   Good  Judgement:  Fair  Insight:  Fair  Psychomotor Activity:  Normal  Concentration:  Concentration: Good and Attention Span: Good  Recall:  Good  Fund of Knowledge: Good  Language: Good  Akathisia:  Negative  Handed:  Right  AIMS (if indicated):not done  Assets:  Communication Skills Desire for Improvement Financial Resources/Insurance Housing Intimacy Social Support  ADL's:  Intact  Cognition: WNL  Sleep:  Good    Assessment and Plan: Pt appears to be doing better interm of her symptoms since her dose of Effexor was maximized and  clonazepam 0.5 mg daily PRN was added to her regimen. She avoids taking clonazepam daily.  1. MDD (major depressive disorder), recurrent, in partial remission (HCC)  - hydrOXYzine (ATARAX/VISTARIL) 10 MG tablet; Take 1 tablet (10 mg total) by mouth 2 (two) times daily as needed for anxiety.  Dispense: 30 tablet; Refill: 2 - venlafaxine XR (EFFEXOR XR) 75 MG 24 hr capsule; Take 3 capsules daily  Dispense: 90 capsule; Refill: 2  2. Generalized anxiety disorder  - hydrOXYzine (ATARAX/VISTARIL) 10 MG tablet; Take 1 tablet (10 mg total) by mouth 2 (two) times daily as needed for anxiety.  Dispense: 30 tablet; Refill: 2 - clonazePAM (KLONOPIN) 0.5 MG tablet; Take 1 tablet (0.5 mg total) by mouth daily as needed for anxiety.  Dispense: 15 tablet; Refill: 2  3. Attention deficit hyperactivity disorder (ADHD), predominantly inattentive type  - atomoxetine (STRATTERA) 60 MG capsule; Take 1 capsule (60 mg total) by mouth daily.  Dispense: 30 capsule; Refill: 2    Continue same medication regimen. Follow up in 3 months.   Zena Amos, MD 11/29/2020, 9:12 AM

## 2021-02-22 ENCOUNTER — Other Ambulatory Visit: Payer: Self-pay

## 2021-02-22 ENCOUNTER — Ambulatory Visit (HOSPITAL_COMMUNITY)
Admission: EM | Admit: 2021-02-22 | Discharge: 2021-02-22 | Disposition: A | Discharge status: Home / Self Care | Payer: No Payment, Other | Attending: Student in an Organized Health Care Education/Training Program | Admitting: Student in an Organized Health Care Education/Training Program

## 2021-02-22 DIAGNOSIS — F332 Major depressive disorder, recurrent severe without psychotic features: Secondary | ICD-10-CM | POA: Insufficient documentation

## 2021-02-22 DIAGNOSIS — Z79899 Other long term (current) drug therapy: Secondary | ICD-10-CM | POA: Insufficient documentation

## 2021-02-22 DIAGNOSIS — Z760 Encounter for issue of repeat prescription: Secondary | ICD-10-CM | POA: Insufficient documentation

## 2021-02-22 DIAGNOSIS — F411 Generalized anxiety disorder: Secondary | ICD-10-CM | POA: Insufficient documentation

## 2021-02-22 MED ORDER — VENLAFAXINE HCL ER 75 MG PO CP24: 225.0000 mg | ORAL_CAPSULE | Freq: Every day | ORAL | 0 refills | Status: DC

## 2021-02-22 NOTE — ED Notes (Signed)
Pt discharged in no acute distress. Denied SI/HI/AVH. RX given to pt. Safety maintained.

## 2021-02-22 NOTE — Discharge Instructions (Addendum)
Dear Ms Tina Warren,  You presented to urgent care for your Effexor prescription. I am providing you with a printout medication prescription for 7 days. Please go to your regular pharmacy on Monday and get here prescription refill as you have 2 refills left on it.  Also:  --Take all medications as prescribed by his/her mental healthcare provider. --Report any adverse effects and or reactions from the medicines to his/her outpatient provider promptly. --Patient has been instructed & cautioned: To not engage in alcohol and or illegal drug use while on prescription medicines. --In the event of worsening symptoms, patient is instructed to call the crisis hotline, 911 and or go to the nearest ED for appropriate evaluation and treatment of symptoms. --To follow-up with his/her primary care provider for your other medical issues, concerns and or health care needs.     Take care! Dr. Gerarda Fraction

## 2021-02-22 NOTE — ED Provider Notes (Signed)
Behavioral Health Urgent Care Medical Screening Exam  Patient Name: Tina Warren MRN: [id number] Date of Evaluation: 02/22/21 Chief Complaint:  Medication refill Diagnosis:  Final diagnoses:  Severe episode of recurrent major depressive disorder, without psychotic features (HCC)    History of Present illness: Tina Warren is a 40 y.o. female with past psychiatric history of major depressive disorder, generalized anxiety disorder, ADHD who presented voluntarily to behavioral health urgent care for her medication Effexor refill. Patient states that she understands that she has 2 refills left on her Effexor but her pharmacy is only open Monday to Friday.  She is out of her medication from last 3 days and was not able to get to go to her pharmacy to get it refilled and is really troubled and feeling some withdrawals from discontinuation of her Effexor.  She is tearful and would like the provider to just give her 7-day prescription so that she can go to her regular pharmacy and get her medication refilled.  She denies any suicidal ideations or homicidal ideations.  She denies any auditory visual hallucinations.  She endorses depressed mood, anxiety and mood swings and states that she would feel better when she is back on her Effexor as she was doing great on her medication.  Patient denies any other complaints.  Psychiatric Specialty Exam  Presentation  General Appearance:Casual  Eye Contact:Good  Speech:Normal Rate  Speech Volume:Normal  Handedness:Right   Mood and Affect  Mood:Dysphoric  Affect:Tearful   Thought Process  Thought Processes:Coherent  Descriptions of Associations:Intact  Orientation:Full (Time, Place and Person)  Thought Content:Logical    Hallucinations:None  Ideas of Reference:None  Suicidal Thoughts:No  Homicidal Thoughts:No   Sensorium  Memory:Immediate Good; Recent Good; Remote Good  Judgment:Good  Insight:Good   Executive Functions   Concentration:Good  Attention Span:Good  Recall:Good  Fund of Knowledge:Good  Language:Good   Psychomotor Activity  Psychomotor Activity:Normal   Assets  Assets:Communication Skills; Desire for Improvement; Housing; Resilience; Transportation   Sleep  Sleep:Fair  Number of hours: No data recorded  Nutritional Assessment (For OBS and [facility] admissions only) Has the patient had a weight loss or gain of 10 pounds or more in the last 3 months?: No Has the patient had a decrease in food intake/or appetite?: No Does the patient have dental problems?: No Does the patient have eating habits or behaviors that may be indicators of an eating disorder including binging or inducing vomiting?: No Has the patient recently lost weight without trying?: No Has the patient been eating poorly because of a decreased appetite?: No Malnutrition Screening Tool Score: 0    Physical Exam: Physical Exam Vitals and nursing note reviewed.  Constitutional:      General: She is not in acute distress.    Appearance: She is well-developed. She is obese.  HENT:     Head: Normocephalic and atraumatic.  Eyes:     Conjunctiva/sclera: Conjunctivae normal.  Cardiovascular:     Rate and Rhythm: Normal rate and regular rhythm.     Heart sounds: No murmur heard.   Pulmonary:     Effort: Pulmonary effort is normal. No respiratory distress.     Breath sounds: Normal breath sounds.  Abdominal:     Palpations: Abdomen is soft.     Tenderness: There is no abdominal tenderness.  Musculoskeletal:     Cervical back: Neck supple.  Skin:    General: Skin is warm and dry.  Neurological:     Mental Status: She is alert and oriented to  person, place, and time.    Review of Systems  Constitutional: Negative.   HENT: Negative.   Eyes: Negative.   Respiratory: Negative.   Gastrointestinal: Negative.   Genitourinary: Negative.   Musculoskeletal: Negative.   Neurological: Negative.    Psychiatric/Behavioral: Positive for depression. The patient is nervous/anxious.    Blood pressure (!) 157/85, pulse 77, temperature 98.7 F (37.1 C), temperature source Tympanic, resp. rate 16, SpO2 100 %. There is no height or weight on file to calculate BMI.  Musculoskeletal: Strength & Muscle Tone: within normal limits Gait & Station: normal Patient leans: N/A  Assessment: Tina Warren 40 year old female with past psychiatric history of MDD, ADHD, GAD presented voluntarily to urgent care for medication refill of Effexor. -- On evaluation patient denies any suicidal or homicidal ideations.  She denies any auditory or visual hallucination and does not seem like responding to internal stimuli.  She is alert, awake, oriented to time place and person but looks anxious and dysphoric.  She was tearful at times trying to explain how she was unable to get her prescription refilled and is in need of her medication Effexor as she has not taken it in the last 4 days.  Patient blood pressure is little elevated and she is asked about symptoms like headache, nausea, vomiting, palpitations patient denies all the signs and symptoms and states that it is probably because she is feeling anxious but promises to seek medical help/Ed if she has any of the symptoms. -- Patient is not suicidal, homicidal or psychotic and does not meet criteria for inpatient psychiatric hospitalization. -- Patient is discharged with 7-day prescription of Effexor and is instructed to follow-up at her regular pharmacy to get the prescription.  She was seen by Dr. Evelene Croon on 11/29/2020 and was provided with prescription refills.  Patient is agreeable to the plan and promises not to forget to fill her medications again.  [facility] Discharge Disposition for Follow up and Recommendations: Based on my evaluation the patient does not appear to have an emergency medical condition and can be discharged with resources and follow up care in  outpatient services for Medication Management   Arnoldo Lenis, MD 02/22/2021, 1:49 PM  PGY-1, Resident

## 2021-02-22 NOTE — Progress Notes (Addendum)
Patient complaining of withdrawal symptoms for the past two days. Withdrawal symptoms reported major depression, struggle with thought process, dizziness and diarrhea. Patient prescribed effexor xr 75mg  (3x daily). Report she has not taken the medication in 4 days. Patient report due to personal issues and financial problems she was unable to the prescription filled. Report today she had extreme withdrawal symptoms. Patient denied suicidal/homicidal ideations, denied auditory/visual hallucinations and denied feelings of paranoia.  Patient has an appointment with Dr. on 02/25/2021 @ 11:30am.  (Routine)   Disposition:  Dr. 02/27/2021, MD, wrote patient a 7 day prescription for Effexor XR 75mg  24 hr capsule (3x daily).

## 2021-02-24 ENCOUNTER — Telehealth (HOSPITAL_COMMUNITY): Payer: Self-pay | Admitting: Psychiatry

## 2021-02-25 ENCOUNTER — Telehealth (HOSPITAL_COMMUNITY): Payer: No Payment, Other | Admitting: Psychiatry

## 2021-02-25 ENCOUNTER — Telehealth (HOSPITAL_COMMUNITY): Payer: Self-pay | Admitting: Psychiatry

## 2021-02-25 ENCOUNTER — Other Ambulatory Visit: Payer: Self-pay

## 2021-02-25 NOTE — Telephone Encounter (Signed)
I called her several times for the scheduled appt today. Some one seemed to answer the phone but there was no voice from the other end. I also sent the text link for the video visit but there was no response to that either.

## 2021-03-04 ENCOUNTER — Encounter (HOSPITAL_COMMUNITY): Payer: Self-pay | Admitting: Psychiatry

## 2021-03-04 ENCOUNTER — Other Ambulatory Visit: Payer: Self-pay

## 2021-03-04 ENCOUNTER — Ambulatory Visit (INDEPENDENT_AMBULATORY_CARE_PROVIDER_SITE_OTHER): Payer: No Payment, Other | Admitting: Psychiatry

## 2021-03-04 VITALS — BP 127/69 | HR 72 | Ht 66.0 in | Wt 227.0 lb

## 2021-03-04 DIAGNOSIS — F3342 Major depressive disorder, recurrent, in full remission: Secondary | ICD-10-CM

## 2021-03-04 DIAGNOSIS — F3341 Major depressive disorder, recurrent, in partial remission: Secondary | ICD-10-CM

## 2021-03-04 DIAGNOSIS — F900 Attention-deficit hyperactivity disorder, predominantly inattentive type: Secondary | ICD-10-CM | POA: Diagnosis not present

## 2021-03-04 DIAGNOSIS — F411 Generalized anxiety disorder: Secondary | ICD-10-CM

## 2021-03-04 MED ORDER — CLONAZEPAM 0.5 MG PO TABS: 0.5000 mg | ORAL_TABLET | Freq: Every day | ORAL | 2 refills | Status: DC | PRN

## 2021-03-04 MED ORDER — ATOMOXETINE HCL 60 MG PO CAPS: 60.0000 mg | ORAL_CAPSULE | Freq: Every day | ORAL | 2 refills | Status: DC

## 2021-03-04 MED ORDER — VENLAFAXINE HCL ER 75 MG PO CP24: ORAL_CAPSULE | ORAL | 2 refills | Status: DC

## 2021-03-04 MED ORDER — HYDROXYZINE HCL 10 MG PO TABS: 10.0000 mg | ORAL_TABLET | Freq: Two times a day (BID) | ORAL | 2 refills | Status: DC | PRN

## 2021-03-04 NOTE — Progress Notes (Signed)
BH MD/PA/NP OP Progress Note    03/04/2021 8:30 AM Tina Warren  MRN:  [id number]  Chief Complaint: " I am doing well."  HPI: Patient reports she is doing well for the most part.  She informed that she is a little stressed over the past few weeks because of several different things.  She informed that she started a new job and she is getting adjusted to that.  She is also in the middle of her move and is moving soon to a new place in [place].  She also reported that she is stressed because of her daughter who is in college and is going through some things. She stated that overall the medications help her but she takes them regularly.  She informed that she ran out of her medications a few weeks ago and had to go to the urgent care center to get her prescription refilled and that was very helpful. She stated that she feels the current regimen is helpful and it is just the stressful events that are bothering her and she is certain that once the stressful events past she will be able to manage things better.  She stated that she is no longer having frequent intrusive thoughts that she used to.  She is able to sleep better and feels refreshed when she wakes up. She requested to continue the same regimen for now.  She does not believe she needs any adjustments in her dose at present.   Visit Diagnosis:    ICD-10-CM   1. MDD (major depressive disorder), recurrent, in full remission (HCC)  F33.42   2. Generalized anxiety disorder  F41.1   3. Attention deficit hyperactivity disorder (ADHD), predominantly inattentive type  F90.0     Past Psychiatric History: MDD, GAD, ADHD  Past Medical History: No past medical history on file.  Past Surgical History:  Procedure Laterality Date  . TONSILLECTOMY      Family Psychiatric History: Mother-anxiety, depression.  26 year old daughter-depression, anxiety   Family History:  Family History  Family history unknown: Yes    Social History:   Social History   Socioeconomic History  . Marital status: Married    Spouse name: Not on file  . Number of children: Not on file  . Years of education: Not on file  . Highest education level: Not on file  Occupational History  . Not on file  Tobacco Use  . Smoking status: Current Every Day Smoker    Packs/day: 0.25    Types: Cigarettes  . Smokeless tobacco: Never Used  Vaping Use  . Vaping Use: Never used  Substance and Sexual Activity  . Alcohol use: Yes    Comment: rarely  . Drug use: No  . Sexual activity: Yes    Birth control/protection: Surgical  Other Topics Concern  . Not on file  Social History Narrative  . Not on file   Social Determinants of Health   Financial Resource Strain: Not on file  Food Insecurity: Not on file  Transportation Needs: Not on file  Physical Activity: Not on file  Stress: Not on file  Social Connections: Not on file    Allergies:  Allergies  Allergen Reactions  . Codeine Nausea Only and Nausea And Vomiting  . Penicillins     Childhood Allergy Has patient had a PCN reaction causing immediate rash, facial/tongue/throat swelling, SOB or lightheadedness with hypotension: Unknown Has patient had a PCN reaction causing severe rash involving mucus membranes or skin necrosis: Unknown Has patient  had a PCN reaction that required hospitalization: Unknown Has patient had a PCN reaction occurring within the last 10 years: Unknown If all of the above answers are "NO", then may proceed with Cephalosporin use.     Metabolic Disorder Labs: No results found for: HGBA1C, MPG No results found for: PROLACTIN No results found for: CHOL, TRIG, HDL, CHOLHDL, VLDL, LDLCALC No results found for: TSH  Therapeutic Level Labs: No results found for: LITHIUM No results found for: VALPROATE No components found for:  CBMZ  Current Medications: Current Outpatient Medications  Medication Sig Dispense Refill  . atomoxetine (STRATTERA) 60 MG capsule Take  1 capsule (60 mg total) by mouth daily. 30 capsule 2  . cetirizine (ZYRTEC ALLERGY) 10 MG tablet Take 1 tablet (10 mg total) by mouth daily. 30 tablet 0  . clonazePAM (KLONOPIN) 0.5 MG tablet Take 1 tablet (0.5 mg total) by mouth daily as needed for anxiety. 15 tablet 2  . venlafaxine XR (EFFEXOR XR) 75 MG 24 hr capsule Take 3 capsules daily 90 capsule 2  . acetaminophen (TYLENOL) 500 MG tablet Take 1 tablet (500 mg total) by mouth every 6 (six) hours as needed. 30 tablet 0  . Aspirin-Caffeine (BC FAST PAIN RELIEF PO) Take 1 packet by mouth daily as needed (pain).    . hydrOXYzine (ATARAX/VISTARIL) 10 MG tablet Take 1 tablet (10 mg total) by mouth 2 (two) times daily as needed for anxiety. 30 tablet 2  . venlafaxine XR (EFFEXOR XR) 75 MG 24 hr capsule Take 3 capsules (225 mg total) by mouth daily for 7 days. 21 capsule 0   No current facility-administered medications for this visit.     Musculoskeletal: Strength & Muscle Tone: within normal limits Gait & Station: normal Patient leans: N/A  Psychiatric Specialty Exam: Review of Systems    Blood pressure 127/69, pulse 72, height 5\' 6"  (1.676 m), weight 227 lb (103 kg), SpO2 99 %.Body mass index is 36.64 kg/m.  General Appearance: Fairly groomed  Eye Contact:  Good  Speech:  Clear and Coherent and Normal Rate  Volume:  Normal  Mood:  Euthymic  Affect:  Congruent  Thought Process:  Goal Directed and Descriptions of Associations: Intact  Orientation:  Full (Time, Place, and Person)  Thought Content: Logical   Suicidal Thoughts:  No  Homicidal Thoughts:  No  Memory:  Immediate;   Good Recent;   Good  Judgement:  Fair  Insight:  Fair  Psychomotor Activity:  Normal  Concentration:  Concentration: Good and Attention Span: Good  Recall:  Good  Fund of Knowledge: Good  Language: Good  Akathisia:  Negative  Handed:  Right  AIMS (if indicated):not done  Assets:  Communication Skills Desire for Improvement Financial  Resources/Insurance Housing Intimacy Social Support  ADL's:  Intact  Cognition: WNL  Sleep:  Good    Assessment and Plan: Patient reported that she is doing well on her current regimen.  She reported some ongoing stressors.   1. MDD (major depressive disorder), recurrent, in full remission (HCC)  - venlafaxine XR (EFFEXOR XR) 75 MG 24 hr capsule; Take 3 capsules daily  Dispense: 90 capsule; Refill: 2  2. Generalized anxiety disorder  - clonazePAM (KLONOPIN) 0.5 MG tablet; Take 1 tablet (0.5 mg total) by mouth daily as needed for anxiety.  Dispense: 15 tablet; Refill: 2 - hydrOXYzine (ATARAX/VISTARIL) 10 MG tablet; Take 1 tablet (10 mg total) by mouth 2 (two) times daily as needed for anxiety.  Dispense: 30 tablet; Refill: 2  3. Attention deficit hyperactivity disorder (ADHD), predominantly inattentive type  - atomoxetine (STRATTERA) 60 MG capsule; Take 1 capsule (60 mg total) by mouth daily.  Dispense: 30 capsule; Refill: 2    Continue same medication regimen. Follow up in 3 months.   Zena Amos, MD 03/04/2021, 8:30 AM

## 2021-04-15 ENCOUNTER — Telehealth (HOSPITAL_COMMUNITY): Payer: Self-pay

## 2021-04-15 MED ORDER — ARIPIPRAZOLE 2 MG PO TABS: 2.0000 mg | ORAL_TABLET | Freq: Every evening | ORAL | 1 refills | Status: DC

## 2021-04-15 MED ORDER — ATOMOXETINE HCL 80 MG PO CAPS: 80.0000 mg | ORAL_CAPSULE | Freq: Every day | ORAL | 1 refills | Status: DC

## 2021-04-15 NOTE — Telephone Encounter (Signed)
I called the pt and spoke with her. Pt stated that she has noticed that she has started to have longer and longer periods of sadness and anxiety. She could not identify and specific stressors or triggers. Writer discussed several options with her. She was informed that she is already on the maximum recommended dose of Effexor XR so next recommendation would be augmenting Effexor XR for optimal control of her symptoms. Writer offered the option of augmentation with either Wellbutrin XL or Abilify. The pros and cons of both were discussed in detail with her. She chose the option of augmentation with Abilify. Potential side effects of medication and risks vs benefits of treatment vs non-treatment were explained and discussed. All questions were answered. She also asked if he dose of Strattera could be adjusted further for optimal effects. Dose of Strattera is being increased to 80 mg daily. RX for Strattera 80 mg and Abilify 2 mg HS were sent to her preferred pharmacy.

## 2021-04-15 NOTE — Telephone Encounter (Signed)
Patient called and stated that she feels depressed, anxious, emotional, and distracted. She stated that she feels it's getting worse and worse. Patient requested a call from you at [phone]. Please review and advise. Thank you

## 2021-05-29 ENCOUNTER — Encounter (HOSPITAL_COMMUNITY): Payer: No Payment, Other | Admitting: Psychiatry

## 2021-06-24 ENCOUNTER — Telehealth (HOSPITAL_COMMUNITY): Payer: Self-pay | Admitting: *Deleted

## 2021-06-24 ENCOUNTER — Other Ambulatory Visit (HOSPITAL_COMMUNITY): Payer: Self-pay | Admitting: Psychiatry

## 2021-06-24 DIAGNOSIS — F411 Generalized anxiety disorder: Secondary | ICD-10-CM

## 2021-06-24 MED ORDER — CLONAZEPAM 0.5 MG PO TABS: 0.5000 mg | ORAL_TABLET | Freq: Every day | ORAL | 2 refills | Status: DC | PRN

## 2021-06-24 NOTE — Telephone Encounter (Signed)
[facility] [place] faxed a request for her Clonazepam, Toy Cookey NP refilled it, Clinical research associate called patient to try to schedule her a return appt to bridge meds to as she missed her last appt. Left her a message re reason for my call and asked her to call back to schedule a new appt.

## 2021-06-24 NOTE — Telephone Encounter (Signed)
Medication refilled and sent to preferred pharmacy.  Patient will have to reschedule an appointment for further refills.

## 2021-06-24 NOTE — Telephone Encounter (Signed)
Fax from [facility] sent requesting a new rx for her Clonazepam. She missed her last appt, she has nothing scheduled for the future. She should be out of it. Will consult her new provider, Toy Cookey, she had been seeing Dr Evelene Croon till she left this practice and see if she is willing to call in rx and I will call her to schedule a future appt.

## 2021-06-24 NOTE — Telephone Encounter (Signed)
Thank you.  Provider read and acknowledged message.

## 2021-08-07 ENCOUNTER — Telehealth (HOSPITAL_COMMUNITY): Payer: Self-pay | Admitting: *Deleted

## 2021-08-07 ENCOUNTER — Other Ambulatory Visit (HOSPITAL_COMMUNITY): Payer: Self-pay | Admitting: Psychiatry

## 2021-08-07 ENCOUNTER — Telehealth (HOSPITAL_COMMUNITY): Payer: Self-pay | Admitting: Psychiatry

## 2021-08-07 DIAGNOSIS — F411 Generalized anxiety disorder: Secondary | ICD-10-CM

## 2021-08-07 DIAGNOSIS — F3342 Major depressive disorder, recurrent, in full remission: Secondary | ICD-10-CM

## 2021-08-07 MED ORDER — ARIPIPRAZOLE 2 MG PO TABS: 2.0000 mg | ORAL_TABLET | Freq: Every evening | ORAL | 2 refills | Status: AC

## 2021-08-07 MED ORDER — CLONAZEPAM 0.5 MG PO TABS: 0.5000 mg | ORAL_TABLET | Freq: Every day | ORAL | 1 refills | Status: DC | PRN

## 2021-08-07 MED ORDER — ATOMOXETINE HCL 80 MG PO CAPS: 80.0000 mg | ORAL_CAPSULE | Freq: Every day | ORAL | 2 refills | Status: DC

## 2021-08-07 MED ORDER — HYDROXYZINE HCL 10 MG PO TABS: 10.0000 mg | ORAL_TABLET | Freq: Two times a day (BID) | ORAL | 2 refills | Status: AC | PRN

## 2021-08-07 MED ORDER — VENLAFAXINE HCL ER 75 MG PO CP24
ORAL_CAPSULE | ORAL | 2 refills | Status: DC
Start: 2021-08-07 — End: 2021-09-24

## 2021-08-07 NOTE — Telephone Encounter (Signed)
Medication refilled and sent to preferred pharmacy

## 2021-08-07 NOTE — Telephone Encounter (Signed)
Patient request refills / bridge medications until appt 09/08/21 with Brittney Paarsons.  Prior [place] patient.  Pt ph [phone]. [employer].

## 2021-08-07 NOTE — Telephone Encounter (Signed)
Provider attempted to call patient without success.  Provider did refill medications and send them to [facility].

## 2021-08-07 NOTE — Telephone Encounter (Signed)
Call from patient stating she needs her medicine, that it is an emergency because she is having withdrawal sx from not having her effexor. States she is having brain fog, and stated "not sure if I am going to make it" She has not been seen since April 2022 by Dr Evelene Croon who has been gone from this office since June. She cancelled her last appt with Dr Doyne Keel. She does not have a future appt scheduled. I called [place], her pharmacy to get clarity on her compliance as she should have been out of meds since aug and she insists she has taken it regularly till yesterday. She filled her rx from Dr Evelene Croon initially on 5/31, then again on 7/7 and lastly on 8/19 so she has not been consistent. I told her to schedule a future appt and I would discuss it with the provider and see what the provider is willing to do for her. Her newly scheduled appt is for 09/08/21.

## 2021-09-08 ENCOUNTER — Encounter (HOSPITAL_COMMUNITY): Payer: No Payment, Other | Admitting: Psychiatry

## 2021-09-08 ENCOUNTER — Encounter (HOSPITAL_COMMUNITY): Payer: Self-pay

## 2021-09-24 ENCOUNTER — Other Ambulatory Visit: Payer: Self-pay

## 2021-09-24 ENCOUNTER — Encounter (HOSPITAL_COMMUNITY): Payer: Self-pay | Admitting: Physician Assistant

## 2021-09-24 ENCOUNTER — Ambulatory Visit (INDEPENDENT_AMBULATORY_CARE_PROVIDER_SITE_OTHER): Payer: No Payment, Other | Admitting: Physician Assistant

## 2021-09-24 VITALS — BP 117/78 | HR 74 | Ht 66.0 in | Wt 222.0 lb

## 2021-09-24 DIAGNOSIS — F411 Generalized anxiety disorder: Secondary | ICD-10-CM

## 2021-09-24 DIAGNOSIS — F900 Attention-deficit hyperactivity disorder, predominantly inattentive type: Secondary | ICD-10-CM

## 2021-09-24 DIAGNOSIS — F3341 Major depressive disorder, recurrent, in partial remission: Secondary | ICD-10-CM | POA: Diagnosis not present

## 2021-09-24 MED ORDER — VENLAFAXINE HCL ER 75 MG PO CP24: ORAL_CAPSULE | ORAL | 2 refills | Status: DC

## 2021-09-24 MED ORDER — ATOMOXETINE HCL 100 MG PO CAPS: 100.0000 mg | ORAL_CAPSULE | Freq: Every day | ORAL | 1 refills | Status: DC

## 2021-09-24 NOTE — Progress Notes (Addendum)
BH MD/PA/NP OP Progress Note  09/24/2021 10:45 AM Paula Busenbark  MRN:  [id number]  Chief Complaint:  Chief Complaint   Stress     HPI:   Tina Warren is a 40 year old female with a past psychiatric history significant for attention deficit hyperactivity disorder, generalized anxiety disorder, and major depressive disorder who presents to [facility] for follow-up and medication management.  Patient was last seen by Dr. Evelene Croon on 03/04/2021.  At the time, patient was being managed on the following medications:  Venlafaxine 75 mg 3 capsules daily Clonazepam 0.5 mg daily as needed Hydroxyzine 10 mg 2 times daily as needed Atomoxetine 60 mg daily  Patient presents today for the continued management of her depression and anxiety.  Since her last encounter, patient has been taking Strattera 80 mg daily.  Patient states that she continues to struggle with scattered thoughts.  Patient finds herself easily distracted providing the example of mistakenly taking an exit ramp to get on the highway.  She endorses anxiety and attributes her anxiety to increased confusion and lack of a new job.  She reports that she has been using her clonazepam more frequently due to her worsening anxiety.  Patient's stressors include being without a car, recently moving into a new country, financial hardships, and dealing with being an empty nester.  Patient also reports that she has been tired for the past 4 weeks and has been experiencing depressive episodes.  Patient endorses the following depressive symptoms: feelings of sadness, decreased motivation, and decreased concentration.  During these depressive episodes, patient states that she found it difficult to muster enough effort to clean or cook.  Patient reports that she is slowly doing better and states that she has been cleaning her house more frequently as well as going to work on time.  Patient still complains that she is never  able to find mindfulness.  Patient denies having any health insurance but states that may change the more she is activated with her job.  Patient is interested in receiving therapy.  A PHQ-9 screen was performed with the patient scoring a 21.  A GAD-7 screen was also performed with the patient scoring a 20.  Patient is alert and oriented x4, calm, cooperative, and fully engaged in conversation during the encounter.  In terms of mood, patient states, "I'm kind of here and going through the motions." Patient denies suicidal or homicidal ideations.  She further denies auditory or visual hallucinations and does not appear to be responding to internal/external stimuli.  Patient endorses fair sleep and receives on average 3 to 4 hours of intermittent sleep.  Patient endorses good appetite and eats on average 2 meals per day.  Patient states that she would eat more but is unable to due to lack of funds.  Patient denies alcohol consumption and illicit drug use.  Patient endorses tobacco use and smokes on average 4 to 5 cigarettes/day.  Patient attributes her tobacco use to her anxiety.  Visit Diagnosis:    ICD-10-CM   1. Attention deficit hyperactivity disorder (ADHD), predominantly inattentive type  F90.0 atomoxetine (STRATTERA) 100 MG capsule    2. MDD (major depressive disorder), recurrent, in partial remission (HCC)  F33.41 venlafaxine XR (EFFEXOR XR) 75 MG 24 hr capsule    3. Generalized anxiety disorder  F41.1 venlafaxine XR (EFFEXOR XR) 75 MG 24 hr capsule      Past Psychiatric History:  Attention deficit hyperactivity disorder Generalized anxiety disorder Major depressive disorder  Past  Medical History: History reviewed. No pertinent past medical history.  Past Surgical History:  Procedure Laterality Date   TONSILLECTOMY      Family Psychiatric History:  Mother - Anxiety, depression 80 year old daughter - Depression, anxiety  Family History:  Family History  Family history unknown: Yes     Social History:  Social History   Socioeconomic History   Marital status: Married    Spouse name: Not on file   Number of children: Not on file   Years of education: Not on file   Highest education level: Not on file  Occupational History   Not on file  Tobacco Use   Smoking status: Every Day    Packs/day: 0.25    Types: Cigarettes   Smokeless tobacco: Never  Vaping Use   Vaping Use: Never used  Substance and Sexual Activity   Alcohol use: Yes    Comment: rarely   Drug use: No   Sexual activity: Yes    Birth control/protection: Surgical  Other Topics Concern   Not on file  Social History Narrative   Not on file   Social Determinants of Health   Financial Resource Strain: Not on file  Food Insecurity: Not on file  Transportation Needs: Not on file  Physical Activity: Not on file  Stress: Not on file  Social Connections: Not on file    Allergies:  Allergies  Allergen Reactions   Codeine Nausea Only and Nausea And Vomiting   Penicillins     Childhood Allergy Has patient had a PCN reaction causing immediate rash, facial/tongue/throat swelling, SOB or lightheadedness with hypotension: Unknown Has patient had a PCN reaction causing severe rash involving mucus membranes or skin necrosis: Unknown Has patient had a PCN reaction that required hospitalization: Unknown Has patient had a PCN reaction occurring within the last 10 years: Unknown If all of the above answers are "NO", then may proceed with Cephalosporin use.     Metabolic Disorder Labs: No results found for: HGBA1C, MPG No results found for: PROLACTIN No results found for: CHOL, TRIG, HDL, CHOLHDL, VLDL, LDLCALC No results found for: TSH  Therapeutic Level Labs: No results found for: LITHIUM No results found for: VALPROATE No components found for:  CBMZ  Current Medications: Current Outpatient Medications  Medication Sig Dispense Refill   ARIPiprazole (ABILIFY) 2 MG tablet Take 1 tablet (2 mg  total) by mouth at bedtime. 30 tablet 2   atomoxetine (STRATTERA) 100 MG capsule Take 1 capsule (100 mg total) by mouth daily. 30 capsule 1   clonazePAM (KLONOPIN) 0.5 MG tablet Take 1 tablet (0.5 mg total) by mouth daily as needed for anxiety. 15 tablet 1   hydrOXYzine (ATARAX/VISTARIL) 10 MG tablet Take 1 tablet (10 mg total) by mouth 2 (two) times daily as needed for anxiety. 30 tablet 2   venlafaxine XR (EFFEXOR XR) 75 MG 24 hr capsule Take 3 capsules daily 90 capsule 2   No current facility-administered medications for this visit.     Musculoskeletal: Strength & Muscle Tone: within normal limits Gait & Station: normal Patient leans: N/A  Psychiatric Specialty Exam: Review of Systems  Psychiatric/Behavioral:  Positive for decreased concentration and sleep disturbance. Negative for dysphoric mood, hallucinations, self-injury and suicidal ideas. The patient is nervous/anxious. The patient is not hyperactive.    Blood pressure 117/78, pulse 74, height 5\' 6"  (1.676 m), weight 222 lb (100.7 kg).Body mass index is 35.83 kg/m.  General Appearance: Well Groomed  Eye Contact:  Good  Speech:  Clear  and Coherent and Normal Rate  Volume:  Normal  Mood:  Anxious and Depressed  Affect:  Congruent and Depressed  Thought Process:  Coherent, Goal Directed, and Descriptions of Associations: Intact  Orientation:  Full (Time, Place, and Person)  Thought Content: WDL   Suicidal Thoughts:  No  Homicidal Thoughts:  No  Memory:  Immediate;   Good Recent;   Good Remote;   Good  Judgement:  Good  Insight:  Fair  Psychomotor Activity:  Normal  Concentration:  Concentration: Good and Attention Span: Fair  Recall:  Good  Fund of Knowledge: Good  Language: Good  Akathisia:  NA  Handed:  Right  AIMS (if indicated): not done  Assets:  Communication Skills Desire for Improvement Financial Resources/Insurance Housing Social Support Vocational/Educational  ADL's:  Intact  Cognition: WNL  Sleep:   Fair   Screenings: GAD-7    Flowsheet Row Office Visit from 09/24/2021 in [facility]  Total GAD-7 Score 20      PHQ2-9    Flowsheet Row Office Visit from 09/24/2021 in [facility]  PHQ-2 Total Score 6  PHQ-9 Total Score 21      Flowsheet Row Office Visit from 09/24/2021 in [facility]  C-SSRS RISK CATEGORY Low Risk        Assessment and Plan:   Verdis Bassette is a 40 year old female with a past psychiatric history significant for attention deficit hyperactivity disorder, generalized anxiety disorder, and major depressive disorder who presents to [facility] for follow-up and medication management.  Patient presents today for follow-up and the medication management of her anxiety, depression, and ADHD.  Patient continues to have issues with her anxiety and depression related to stressors in her life.  She expresses that she has also found it difficult to focus/concentrate and believes that she may need adjustments on her Strattera medication.  Provider recommended increasing her dosage of Strattera from 80 mg to 100 mg daily.  Patient was agreeable to recommendation.  Patient also noted having sleep disturbances and only receiving up to 3 to 4 hours of sleep characterized by constantly waking up.  Provider recommended patient try melatonin for the management of her sleep.  Patient was agreeable to recommendation.  Patient would like to continue taking her other medications as is.  Patient's medications to be e-prescribed to pharmacy of choice.  1. Attention deficit hyperactivity disorder (ADHD), predominantly inattentive type  - atomoxetine (STRATTERA) 100 MG capsule; Take 1 capsule (100 mg total) by mouth daily.  Dispense: 30 capsule; Refill: 1  2. MDD (major depressive disorder), recurrent, in partial remission (HCC)  - venlafaxine XR (EFFEXOR XR) 75 MG  24 hr capsule; Take 3 capsules daily  Dispense: 90 capsule; Refill: 2  3. Generalized anxiety disorder Patient to continue taking clonazepam 0.5 mg daily as needed for the management of her generalized anxiety disorder Patient to continue taking hydroxyzine 10 mg 2 times daily as needed for the management of her generalized anxiety disorder  - venlafaxine XR (EFFEXOR XR) 75 MG 24 hr capsule; Take 3 capsules daily  Dispense: 90 capsule; Refill: 2  Patient to follow up in 2 months Provider spent a total of 26 minutes with the patient/reviewing the patient's chart  Meta Hatchet, PA 09/24/2021, 10:45 AM

## 2021-11-10 ENCOUNTER — Emergency Department (HOSPITAL_COMMUNITY): Payer: PRIVATE HEALTH INSURANCE

## 2021-11-10 ENCOUNTER — Emergency Department (HOSPITAL_COMMUNITY)
Admission: EM | Admit: 2021-11-10 | Discharge: 2021-11-10 | Disposition: A | Discharge status: Home / Self Care | Payer: PRIVATE HEALTH INSURANCE | Attending: Emergency Medicine | Admitting: Emergency Medicine

## 2021-11-10 ENCOUNTER — Other Ambulatory Visit: Payer: Self-pay

## 2021-11-10 ENCOUNTER — Encounter (HOSPITAL_COMMUNITY): Payer: Self-pay

## 2021-11-10 DIAGNOSIS — H5712 Ocular pain, left eye: Secondary | ICD-10-CM | POA: Diagnosis present

## 2021-11-10 DIAGNOSIS — H538 Other visual disturbances: Secondary | ICD-10-CM | POA: Diagnosis not present

## 2021-11-10 DIAGNOSIS — Z20822 Contact with and (suspected) exposure to covid-19: Secondary | ICD-10-CM | POA: Diagnosis not present

## 2021-11-10 DIAGNOSIS — R202 Paresthesia of skin: Secondary | ICD-10-CM | POA: Insufficient documentation

## 2021-11-10 LAB — CBC WITH DIFFERENTIAL/PLATELET
Abs Immature Granulocytes: 0.02 10*3/uL (ref 0.00–0.07)
Basophils Absolute: 0 10*3/uL (ref 0.0–0.1)
Basophils Relative: 1 %
Eosinophils Absolute: 0 10*3/uL (ref 0.0–0.5)
Eosinophils Relative: 0 %
HCT: 36.3 % (ref 36.0–46.0)
Hemoglobin: 11.8 g/dL — ABNORMAL LOW (ref 12.0–15.0)
Immature Granulocytes: 0 %
Lymphocytes Relative: 23 %
Lymphs Abs: 1.3 10*3/uL (ref 0.7–4.0)
MCH: 27.5 pg (ref 26.0–34.0)
MCHC: 32.5 g/dL (ref 30.0–36.0)
MCV: 84.6 fL (ref 80.0–100.0)
Monocytes Absolute: 0.3 10*3/uL (ref 0.1–1.0)
Monocytes Relative: 5 %
Neutro Abs: 3.9 10*3/uL (ref 1.7–7.7)
Neutrophils Relative %: 71 %
Platelets: 401 10*3/uL — ABNORMAL HIGH (ref 150–400)
RBC: 4.29 MIL/uL (ref 3.87–5.11)
RDW: 14.5 % (ref 11.5–15.5)
WBC: 5.5 10*3/uL (ref 4.0–10.5)
nRBC: 0 % (ref 0.0–0.2)

## 2021-11-10 LAB — COMPREHENSIVE METABOLIC PANEL
ALT: 16 U/L (ref 0–44)
AST: 13 U/L — ABNORMAL LOW (ref 15–41)
Albumin: 3.9 g/dL (ref 3.5–5.0)
Alkaline Phosphatase: 112 U/L (ref 38–126)
Anion gap: 3 — ABNORMAL LOW (ref 5–15)
BUN: 11 mg/dL (ref 6–20)
CO2: 25 mmol/L (ref 22–32)
Calcium: 8.3 mg/dL — ABNORMAL LOW (ref 8.9–10.3)
Chloride: 110 mmol/L (ref 98–111)
Creatinine, Ser: 0.57 mg/dL (ref 0.44–1.00)
GFR, Estimated: >60 mL/min (ref >60)
Glucose, Bld: 86 mg/dL (ref 70–99)
Potassium: 3.7 mmol/L (ref 3.5–5.1)
Sodium: 138 mmol/L (ref 135–145)
Total Bilirubin: 0.4 mg/dL (ref 0.3–1.2)
Total Protein: 7.8 g/dL (ref 6.5–8.1)

## 2021-11-10 LAB — RESP PANEL BY RT-PCR (FLU A&B, COVID) ARPGX2
Influenza A by PCR: NEGATIVE
Influenza B by PCR: NEGATIVE
SARS Coronavirus 2 by RT PCR: NEGATIVE

## 2021-11-10 MED ORDER — ACETAMINOPHEN 325 MG PO TABS
650.0000 mg | ORAL_TABLET | Freq: Once | ORAL | Status: AC
Start: 2021-11-10 — End: 2021-11-10
  Administered 2021-11-10: 650 mg via ORAL
  Filled 2021-11-10: qty 2

## 2021-11-10 MED ORDER — GADOBUTROL 1 MMOL/ML IV SOLN
10.0000 mL | Freq: Once | INTRAVENOUS | Status: AC | PRN
  Administered 2021-11-10: 10 mL via INTRAVENOUS

## 2021-11-10 NOTE — ED Provider Notes (Signed)
Emergency Medicine Provider Triage Evaluation Note  Tina Warren , a 41 y.o. female  was evaluated in triage.  Pt complains of blurred vision that started 4 days ago.  She visited her optometrist at eye [facility] on Saturday, 2 days ago, where he told her that she had inflammation of her left optic nerve.  He did not instruct her to go to the emergency department, stating that she should see a specialist whenever everything opens back up after New Year's.  Patient's vision continued to get worse and she now has intractable headaches as well so she came to the ED today.  She has no history of an autoimmune disease, however she has been having global joint pains that started recently over the last couple of months.  Review of Systems  Positive: Blurred vision on the left, headaches, joint pains Negative: Fevers, chills, eye pain, eye discharge  Physical Exam  BP 119/89 (BP Location: Left Arm)    Pulse 75    Temp 98.4 F (36.9 C) (Oral)    Resp 16    SpO2 100%  Gen:   Awake, no distress   Resp:  Normal effort  MSK:   Moves extremities without difficulty  Other:  Patient is positive for desaturation test with red coloring being more faded on the left eye.  Medical Decision Making  Medically screening exam initiated at 1:05 PM.  Appropriate orders placed.  Cordula Warr was informed that the remainder of the evaluation will be completed by another provider, this initial triage assessment does not replace that evaluation, and the importance of remaining in the ED until their evaluation is complete.  Suspect optic neuritis imaging and labs ordered.    Tonye Pearson, [place] 11/10/21 1308    Tegeler, Gwenyth Allegra, MD 11/10/21 [phone]

## 2021-11-10 NOTE — ED Triage Notes (Signed)
Pt reports sudden onset of blurred vision in left eye on Thursday. Pt then reports blurred vision in right eye that began over the past few days. Pt states she was seen by ophthalmology on Saturday and was told that she has an inflamed optic nerve in left eye. Pt reports vision has become worse since then.

## 2021-11-10 NOTE — Discharge Instructions (Addendum)
You have blurry vision.  Your MRI today did not show any mass.  You need to see Dr. Wynelle Link tomorrow morning for a full eye exam.  If you have optic neuritis on your eye exam, you may need admission for IV steroids.  Please also see neurology for follow-up  Return to ER if you have worse blurry vision, numbness or weakness or trouble speaking

## 2021-11-10 NOTE — ED Provider Notes (Signed)
[facility]-EMERGENCY DEPT Provider Note   CSN: [id number] Arrival date & time: 11/10/21  1139     History  Chief Complaint  Patient presents with   Eye Problem   Blurred Vision    Tina Warren is a 41 y.o. female here with left eye blurry vision.  Patient states that she started having left eye blurry vision for the last 4 days.  She went to optometrist at eye [facility] 2 days ago and had a dilated eye exam and was told that she has optic neuritis on the left side.  She was told that she will be referred to her ophthalmologist and if her vision gets worse, she can come to the ER for evaluation.  Patient states that she has no eye pain.  She states that she does have some numbness bilateral legs that is intermittent for the last several months.  Patient has never been diagnosed with multiple sclerosis.  The history is provided by the patient.      Home Medications Prior to Admission medications   Medication Sig Start Date End Date Taking? Authorizing Provider  ARIPiprazole (ABILIFY) 2 MG tablet Take 1 tablet (2 mg total) by mouth at bedtime. 08/07/21   Tina [facility], Tina E, NP  atomoxetine (STRATTERA) 100 MG capsule Take 1 capsule (100 mg total) by mouth daily. 09/24/21 09/24/22  Tina [facility], Tina OlpUchenna E, PA  clonazePAM (KLONOPIN) 0.5 MG tablet Take 1 tablet (0.5 mg total) by mouth daily as needed for anxiety. 08/07/21   Tina Warren, Tina E, NP  hydrOXYzine (ATARAX/VISTARIL) 10 MG tablet Take 1 tablet (10 mg total) by mouth 2 (two) times daily as needed for anxiety. 08/07/21   Tina Warren, Tina E, NP  venlafaxine XR (EFFEXOR XR) 75 MG 24 hr capsule Take 3 capsules daily 09/24/21   Tina Warren, Tina OlpUchenna E, PA      Allergies    Codeine and Penicillins    Review of Systems   Review of Systems  Eyes:  Positive for visual disturbance.  All other systems reviewed and are negative.  Physical Exam Updated Vital Signs BP (!) 102/59    Pulse 71    Temp 98.4 F (36.9 C) (Oral)    Resp 16     SpO2 95%  Physical Exam Vitals and nursing note reviewed.  Constitutional:      Appearance: Normal appearance.  HENT:     Head: Normocephalic.     Nose: Nose normal.     Mouth/Throat:     Mouth: Mucous membranes are moist.  Eyes:     Extraocular Movements: Extraocular movements intact.     Pupils: Pupils are equal, round, and reactive to light.     Comments: Extraocular movements intact.  Vision is 20/100 right side and 20/70 left side and 20/50 bilaterally with glasses.  Cardiovascular:     Rate and Rhythm: Normal rate and regular rhythm.     Pulses: Normal pulses.     Heart sounds: Normal heart sounds.  Pulmonary:     Effort: Pulmonary effort is normal.     Breath sounds: Normal breath sounds.  Abdominal:     General: Abdomen is flat.     Palpations: Abdomen is soft.  Musculoskeletal:        General: Normal range of motion.     Cervical back: Normal range of motion and neck supple.  Skin:    General: Skin is warm.  Neurological:     General: No focal deficit present.     Mental Status:  She is alert and oriented to person, place, and time.     Cranial Nerves: No cranial nerve deficit.     Sensory: No sensory deficit.     Comments: No obvious facial droop, cranial nerves II to XII intact.  Normal gait.  Normal strength and sensation bilateral  Psychiatric:        Mood and Affect: Mood normal.        Behavior: Behavior normal.    ED Results / Procedures / Treatments   Labs (all labs ordered are listed, but only abnormal results are displayed) Labs Reviewed  COMPREHENSIVE METABOLIC PANEL - Abnormal; Notable for the following components:      Result Value   Calcium 8.3 (*)    AST 13 (*)    Anion gap 3 (*)    All other components within normal limits  CBC WITH DIFFERENTIAL/PLATELET - Abnormal; Notable for the following components:   Hemoglobin 11.8 (*)    Platelets 401 (*)    All other components within normal limits  RESP PANEL BY RT-PCR (FLU A&B, COVID) [id number]   URINALYSIS, ROUTINE W REFLEX MICROSCOPIC  ANA W/REFLEX IF POSITIVE    EKG None  Radiology No results found.  Procedures Procedures  EMERGENCY DEPARTMENT [place] OCULAR EXAM "Study: Limited Ultrasound of Orbit "  INDICATIONS: Vision loss Linear probe utilized to obtain images in both long and short axis of the orbit having the patient look left and right if possible.  PERFORMED BY: Myself IMAGES ARCHIVED?: No LIMITATIONS: none VIEWS USED: Left orbit INTERPRETATION: No retinal detachment   Medications Ordered in ED Medications - No data to display  ED Course/ Medical Decision Making/ A&P                           Medical Decision Making  Tina Warren is a 41 y.o. female here with left eye blurry vision.  Symptoms going on for the last 4 days.  Patient's vision is 20/100 R side, 20/70 L side.  Her left eye vision is actually better than the right.  I do not see any vitreous hemorrhage or retinal detachment on ultrasound.  Consulted Tina Warren from ophthalmology.  She agreed with MRI brain and orbits to rule out mass or multiple sclerosis.  She states that she will see patient tomorrow    This patient presents to the ED for concern of left-sided pleural effusion, this involves an extensive number of treatment options, and is a complaint that carries with it a high risk of complications and morbidity.  The differential diagnosis includes multiple sclerosis, optic neuritis, brain mass   Co morbidities that complicate the patient evaluation    Additional history obtained: Additional history obtained from chart review External records from outside source obtained and reviewed   Lab Tests: I Ordered, and personally interpreted labs.  The pertinent results include: Normal CBC and CMP   Imaging Studies ordered: I ordered imaging studies including MRI brain and orbits I independently visualized and interpreted imaging which showed Scattered T2 lesions but no obvious mass or  obvious MS  I agree with the radiologist interpretation    Medicines ordered and prescription drug management: I ordered medication- none Reevaluation of the patient after these medicines showed that the patient stayed the same I have reviewed the patients home medicines and have made adjustments as needed   Test Considered: MRI brain and orbits   Critical Interventions: I talked to neurology and ophthalmology  Consultations Obtained: I requested consultation with the Tina Warren from ophthalmology,  and discussed lab and imaging findings as well as pertinent plan - they recommend: MRI brain and orbits and follow-up outpatient tomorrow   Problem List / ED Course: Left eye blurry vision  7:39 PM I discussed case with Dr. Leonel Ramsay from neurology.  CT findings showed scattered T2 lesions but no obvious signs of multiple sclerosis.  He states that patient can either get a repeat MRI of the C and T-spine or patient can get a good dilated exam from ophthalmology.  Patient decided to go to see ophthalmology tomorrow.  If she has optic neuritis, anticipate that she will need admission for IV steroids at home  Reevaluation: After the interventions noted above, I reevaluated the patient and found that they have :improved   Dispostion: After consideration of the diagnostic results and the patients response to treatment, I feel that the patent would benefit from discharge with follow-up with Ophtho tomorrow.  Final Clinical Impression(s) / ED Diagnoses Final diagnoses:  None    Rx / DC Orders ED Discharge Orders     None         Drenda Freeze, MD 11/10/21 1941

## 2021-11-11 LAB — ANA W/REFLEX IF POSITIVE: Anti Nuclear Antibody (ANA): NEGATIVE

## 2021-11-21 ENCOUNTER — Encounter (HOSPITAL_COMMUNITY): Payer: Self-pay

## 2021-11-21 ENCOUNTER — Ambulatory Visit (HOSPITAL_COMMUNITY): Payer: No Payment, Other | Admitting: Licensed Clinical Social Worker

## 2021-11-21 ENCOUNTER — Telehealth (HOSPITAL_COMMUNITY): Payer: Self-pay | Admitting: Licensed Clinical Social Worker

## 2021-11-21 ENCOUNTER — Encounter (HOSPITAL_COMMUNITY): Payer: Self-pay | Admitting: Physician Assistant

## 2021-11-21 ENCOUNTER — Telehealth (INDEPENDENT_AMBULATORY_CARE_PROVIDER_SITE_OTHER): Payer: No Payment, Other | Admitting: Physician Assistant

## 2021-11-21 DIAGNOSIS — F900 Attention-deficit hyperactivity disorder, predominantly inattentive type: Secondary | ICD-10-CM | POA: Diagnosis not present

## 2021-11-21 DIAGNOSIS — F411 Generalized anxiety disorder: Secondary | ICD-10-CM

## 2021-11-21 DIAGNOSIS — F3341 Major depressive disorder, recurrent, in partial remission: Secondary | ICD-10-CM | POA: Diagnosis not present

## 2021-11-21 NOTE — Progress Notes (Signed)
BH MD/PA/NP OP Progress Note  Virtual Visit via Telephone Note  I connected with Tina Warren on 11/21/21 at 11:00 AM EST by telephone and verified that I am speaking with the correct person using two identifiers.  Location: Patient: Hospital Provider: Clinic   I discussed the limitations, risks, security and privacy concerns of performing an evaluation and management service by telephone and the availability of in person appointments. I also discussed with the patient that there may be a patient responsible charge related to this service. The patient expressed understanding and agreed to proceed.  Follow Up Instructions:  I discussed the assessment and treatment plan with the patient. The patient was provided an opportunity to ask questions and all were answered. The patient agreed with the plan and demonstrated an understanding of the instructions.   The patient was advised to call back or seek an in-person evaluation if the symptoms worsen or if the condition fails to improve as anticipated.  I provided 15 minutes of non-face-to-face time during this encounter.  Meta Hatchet, PA    11/21/2021 11:13 AM Tina Warren  MRN:  [id number]  Chief Complaint: Follow up and medication management  HPI:   Tina Warren is a 41 year old female with a past psychiatric history significant for attention deficit hyperactivity disorder, generalized anxiety disorder, and major depressive disorder who presents to [facility] via virtual telephone visit for follow-up and medication management.  Patient is currently being managed on the following medications:  Venlafaxine 75 mg 3 capsules daily Clonazepam 0.5 mg daily as needed Hydroxyzine 10 mg 2 times daily as needed Atomoxetine 100 mg daily  Patient reports that she is currently hospitalized at [facility] due to bacterial infection and being unable to see out of her left eye.  She  reports that she is being seen by infectious diseases as well as ophthalmology.  Patient endorses some depressive episodes attributing her symptoms to her recent illnesses.  Prior to being hospitalized, patient endorsed instances of breaking down along with crying spells.  She reports that she feels better, however, her anxiety has been pretty bad as of late.  Patient denies any new stressors at this time.  Patient is continuing to take her medications as prescribed but is interested in being placed on a different antidepressant.  Patient is unsure of antidepressants that she has done in the past.  She reports that her daughter has been prescribed Zoloft and it has been helpful in her symptoms.  Patient reports that she will wait until she is out of the hospital to determine if she wants to be taken off her venlafaxine.  A PHQ 9 screen was performed with the patient scoring a 19.  A GAD-7 screen was also performed with the patient scoring a 16.  Patient is alert and oriented x4, calm, cooperative, and fully engaged in conversation during the encounter.  Patient endorses neutral mood.  Patient denies suicidal or homicidal ideations.  She further denies auditory or visual hallucinations and does not appear to be responding to internal/external stimuli.  Patient endorses variable sleep stating that she receives on average 4 to 5 hours of sleep each night.  The night before, she states that she received roughly 3 hours of sleep.  Patient endorses good appetite and eats on average 2-3 meals per day.  Patient denies alcohol consumption.  She endorses tobacco use and smokes on average 4 to 5 cigarettes/day.  Patient endorses illicit drug use in the  form of marijuana.  Visit Diagnosis: No diagnosis found.  Past Psychiatric History:  Attention deficit hyperactivity disorder Generalized anxiety disorder Major depressive disorder  Past Medical History: History reviewed. No pertinent past medical history.  Past  Surgical History:  Procedure Laterality Date   TONSILLECTOMY      Family Psychiatric History:  Mother - Anxiety, depression 43 year old daughter - Depression, anxiety  Family History:  Family History  Family history unknown: Yes    Social History:  Social History   Socioeconomic History   Marital status: Married    Spouse name: Not on file   Number of children: Not on file   Years of education: Not on file   Highest education level: Not on file  Occupational History   Not on file  Tobacco Use   Smoking status: Every Day    Packs/day: 0.25    Types: Cigarettes   Smokeless tobacco: Never  Vaping Use   Vaping Use: Never used  Substance and Sexual Activity   Alcohol use: Yes    Comment: rarely   Drug use: No   Sexual activity: Yes    Birth control/protection: Surgical  Other Topics Concern   Not on file  Social History Narrative   Not on file   Social Determinants of Health   Financial Resource Strain: Not on file  Food Insecurity: Not on file  Transportation Needs: Not on file  Physical Activity: Not on file  Stress: Not on file  Social Connections: Not on file    Allergies:  Allergies  Allergen Reactions   Codeine Nausea Only and Nausea And Vomiting   Penicillins     Childhood Allergy Has patient had a PCN reaction causing immediate rash, facial/tongue/throat swelling, SOB or lightheadedness with hypotension: Unknown Has patient had a PCN reaction causing severe rash involving mucus membranes or skin necrosis: Unknown Has patient had a PCN reaction that required hospitalization: Unknown Has patient had a PCN reaction occurring within the last 10 years: Unknown If all of the above answers are "NO", then may proceed with Cephalosporin use.     Metabolic Disorder Labs: No results found for: HGBA1C, MPG No results found for: PROLACTIN No results found for: CHOL, TRIG, HDL, CHOLHDL, VLDL, LDLCALC No results found for: TSH  Therapeutic Level Labs: No  results found for: LITHIUM No results found for: VALPROATE No components found for:  CBMZ  Current Medications: Current Outpatient Medications  Medication Sig Dispense Refill   ARIPiprazole (ABILIFY) 2 MG tablet Take 1 tablet (2 mg total) by mouth at bedtime. 30 tablet 2   atomoxetine (STRATTERA) 100 MG capsule Take 1 capsule (100 mg total) by mouth daily. 30 capsule 1   clonazePAM (KLONOPIN) 0.5 MG tablet Take 1 tablet (0.5 mg total) by mouth daily as needed for anxiety. 15 tablet 1   hydrOXYzine (ATARAX/VISTARIL) 10 MG tablet Take 1 tablet (10 mg total) by mouth 2 (two) times daily as needed for anxiety. 30 tablet 2   venlafaxine XR (EFFEXOR XR) 75 MG 24 hr capsule Take 3 capsules daily 90 capsule 2   No current facility-administered medications for this visit.     Musculoskeletal: Strength & Muscle Tone: Unable to assess due to telemedicine visit Gait & Station: Unable to assess due to telemedicine visit Patient leans: Unable to assess due to telemedicine visit  Psychiatric Specialty Exam: Review of Systems  Psychiatric/Behavioral:  Positive for sleep disturbance. Negative for decreased concentration, dysphoric mood, hallucinations, self-injury and suicidal ideas. The patient is nervous/anxious. The  patient is not hyperactive.    There were no vitals taken for this visit.There is no height or weight on file to calculate BMI.  General Appearance: Unable to assess due to telemedicine visit  Eye Contact:  Unable to assess due to telemedicine visit  Speech:  Clear and Coherent and Normal Rate  Volume:  Normal  Mood:  Anxious and Depressed  Affect:  Congruent  Thought Process:  Coherent and Descriptions of Associations: Intact  Orientation:  Full (Time, Place, and Person)  Thought Content: WDL   Suicidal Thoughts:  No  Homicidal Thoughts:  No  Memory:  Immediate;   Good Recent;   Good Remote;   Good  Judgement:  Good  Insight:  Fair  Psychomotor Activity:  Normal   Concentration:  Concentration: Good and Attention Span: Good  Recall:  Good  Fund of Knowledge: Good  Language: Good  Akathisia:  No  Handed:  Right  AIMS (if indicated): not done  Assets:  Communication Skills Desire for Improvement Financial Resources/Insurance Housing Vocational/Educational  ADL's:  Intact  Cognition: WNL  Sleep:  Fair   Screenings: GAD-7    Flowsheet Row Video Visit from 11/21/2021 in [facility] Office Visit from 09/24/2021 in [facility]  Total GAD-7 Score 16 20      PHQ2-9    Flowsheet Row Video Visit from 11/21/2021 in [facility] Office Visit from 09/24/2021 in [facility]  PHQ-2 Total Score 4 6  PHQ-9 Total Score 19 21      Flowsheet Row Video Visit from 11/21/2021 in [facility] ED from 11/10/2021 in [facility] Office Visit from 09/24/2021 in [facility]  C-SSRS RISK CATEGORY No Risk No Risk Low Risk        Assessment and Plan:   Tina Warren is a 41 year old female with a past psychiatric history significant for attention deficit hyperactivity disorder, generalized anxiety disorder, and major depressive disorder who presents to [facility] via virtual telephone visit for follow-up and medication management.  Patient is currently admitted to [facility] due to bacterial infection and trouble with her vision.  Patient denies minimal depressive episodes but states that she is interested in being placed on a new antidepressant.  Patient endorses anxiety due to her current stressors.  Patient to wait to be placed on a different antidepressant once she is discharged from the hospital.  Provider to discuss options with the patient on her next encounter.  Patient denies the need for refills at  this time.  1. Attention deficit hyperactivity disorder (ADHD), predominantly inattentive type Patient to continue taking atomoxetine 100 mg daily for the management of her attention deficit hyperactivity disorder  2. MDD (major depressive disorder), recurrent, in partial remission (HCC) Patient to continue taking venlafaxine 225 mg daily for the management of her major depressive disorder  3. Generalized anxiety disorder Patient to continue taking venlafaxine 225 mg daily for the management of her generalized anxiety disorder Patient to continue taking clonazepam 0.5 mg daily as needed for the management of her generalized anxiety disorder  Patient to follow up in 2 months Provider spent a total of 15 minutes with the patient/reviewing patient's chart  Meta Hatchet, PA 11/21/2021, 11:13 AM

## 2021-11-21 NOTE — Telephone Encounter (Signed)
LCSW sent links at 0902, [id number], 0910, and 0912 with no response. LCSW stayed in links until 0915 before disconnecting.

## 2021-11-25 ENCOUNTER — Ambulatory Visit: Payer: Self-pay | Admitting: Family Medicine

## 2022-01-20 ENCOUNTER — Telehealth (HOSPITAL_COMMUNITY): Payer: PRIVATE HEALTH INSURANCE | Admitting: Physician Assistant

## 2022-01-20 ENCOUNTER — Encounter (HOSPITAL_COMMUNITY): Payer: Self-pay

## 2022-01-22 ENCOUNTER — Other Ambulatory Visit (HOSPITAL_COMMUNITY): Payer: Self-pay | Admitting: Psychiatry

## 2022-01-22 ENCOUNTER — Other Ambulatory Visit (HOSPITAL_COMMUNITY): Payer: Self-pay | Admitting: Physician Assistant

## 2022-03-19 ENCOUNTER — Emergency Department (HOSPITAL_COMMUNITY)
Admission: EM | Admit: 2022-03-19 | Discharge: 2022-03-19 | Disposition: A | Discharge status: Home / Self Care | Payer: PRIVATE HEALTH INSURANCE | Attending: Emergency Medicine | Admitting: Emergency Medicine

## 2022-03-19 ENCOUNTER — Other Ambulatory Visit: Payer: Self-pay

## 2022-03-19 ENCOUNTER — Encounter (HOSPITAL_COMMUNITY): Payer: Self-pay

## 2022-03-19 DIAGNOSIS — Z4800 Encounter for change or removal of nonsurgical wound dressing: Secondary | ICD-10-CM

## 2022-03-19 DIAGNOSIS — Z4801 Encounter for change or removal of surgical wound dressing: Secondary | ICD-10-CM | POA: Diagnosis present

## 2022-03-19 NOTE — Discharge Instructions (Signed)
Follow-up with primary doctor and return if you develop any new and/or concerning symptoms. ?

## 2022-03-19 NOTE — ED Provider Notes (Signed)
?  [facility] DEPT ?Provider Note ? ? ?CSN: [id number] ?Arrival date & time: 03/19/22  0443 ? ?  ? ?History ? ?Chief Complaint  ?Patient presents with  ? Dressing Change  ? ? ?Tina Warren is a 41 y.o. female. ? 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  This was placed at [facility].  Patient presenting today requesting a dressing change.  She tells me that she got the PICC line dressing wet and would like a new one.  She does have a dressing in place that she had at home, but wants this changed.  She denies any other complaints. ? ?The history is provided by the patient.  ? ?  ? ?Home Medications ?Prior to Admission medications   ?Medication Sig Start Date End Date Taking? Authorizing Provider  ?ARIPiprazole (ABILIFY) 2 MG tablet Take 1 tablet (2 mg total) by mouth at bedtime. 08/07/21   Shanna Cisco, NP  ?atomoxetine (STRATTERA) 100 MG capsule TAKE 1 CAPSULE (100 MG TOTAL) BY MOUTH DAILY. 01/23/22 01/23/23  Nwoko, Tommas Olp, PA  ?clonazePAM (KLONOPIN) 0.5 MG tablet Take 1 tablet (0.5 mg total) by mouth daily as needed for anxiety. 08/07/21   Shanna Cisco, NP  ?hydrOXYzine (ATARAX/VISTARIL) 10 MG tablet Take 1 tablet (10 mg total) by mouth 2 (two) times daily as needed for anxiety. 08/07/21   Shanna Cisco, NP  ?venlafaxine XR (EFFEXOR XR) 75 MG 24 hr capsule Take 3 capsules daily 09/24/21   Nwoko, Tommas Olp, PA  ?   ? ?Allergies    ?Codeine and Penicillins   ? ?Review of Systems   ?Review of Systems  ?All other systems reviewed and are negative. ? ?Physical Exam ?Updated Vital Signs ?BP (!) 141/81 (BP Location: Left Arm)   Pulse 87   Temp 97.8 ?F (36.6 ?C) (Oral)   Resp 16   Ht 5' 5.5" (1.664 m)   Wt 104.3 kg   SpO2 97%   BMI 37.69 kg/m?  ?Physical Exam ? ?ED Results / Procedures / Treatments   ?Labs ?(all labs ordered are listed, but only  abnormal results are displayed) ?Labs Reviewed - No data to display ? ?EKG ?None ? ?Radiology ?No results found. ? ?Procedures ?Procedures  ? ? ?Medications Ordered in ED ?Medications - No data to display ? ?ED Course/ Medical Decision Making/ A&P ? ?PICC line dressing changed.  Patient has no other complaints and I feel can safely be discharged. ? ?Final Clinical Impression(s) / ED Diagnoses ?Final diagnoses:  ?None  ? ? ?Rx / DC Orders ?ED Discharge Orders   ? ? None  ? ?  ? ? ?  ?Geoffery Lyons, MD ?03/19/22 [phone] ? ?

## 2022-03-19 NOTE — ED Notes (Signed)
PICC dressing changed using sterile technique. Site remains clean with no s/s of infection. Pt tolerated well. ?

## 2022-03-19 NOTE — ED Triage Notes (Signed)
Pt had a PICC line placed for abt therapy and just needs a new PICC line dressing because the other one got wet.  ?

## 2022-05-23 ENCOUNTER — Ambulatory Visit (HOSPITAL_COMMUNITY)
Admission: EM | Admit: 2022-05-23 | Discharge: 2022-05-23 | Disposition: A | Discharge status: Home / Self Care | Payer: No Payment, Other | Attending: Psychiatry | Admitting: Psychiatry

## 2022-05-23 DIAGNOSIS — F1910 Other psychoactive substance abuse, uncomplicated: Secondary | ICD-10-CM | POA: Insufficient documentation

## 2022-05-23 DIAGNOSIS — F331 Major depressive disorder, recurrent, moderate: Secondary | ICD-10-CM | POA: Insufficient documentation

## 2022-05-23 NOTE — Discharge Instructions (Signed)
Substance Abuse Treatment Programs ° °Intensive Outpatient Programs °[facility]     °[address]      °[place], [place]                   °[phone]      ° °[facility] °[address] °[place], [place] °[phone] ° °[facility]     °(Inpatient and outpatient)     °[address]           °[phone]   ° °[facility] °[phone] (Suboxone and Methadone) ° °[address]      °[place], [place] [address]      °[phone]      ° °[address] °[place], [place] °852-3033 ° °[facility] (Outpatient/Inpatient, [facility])    °(insurance only) [phone]      °       °[facility] (Groups & Residential) °[place], [place] °[phone] ° °   °[facility]     °[address]     °[place], [place]      °[phone]      ° °[facility] (for caregivers and family) °[address] °[place], [place] °336-299-4655 ° ° ° ° ° °Residential Treatment Programs °[facility]      °[address], [place], [place] [address]  °[phone]      ° °[facility] °[address] [place], [place] [address] °[phone] ° °[facility]        °336-248-8914      ° °[facility] °[phone] ° °[facility]             °[address]                                         °[place], [place]                                                °[phone] or [phone]                              ° °[facility] [place] °[address] °[place], [address] °[phone] ° °[facility]    °[address]      °[place], [place]     °[phone]      ° °[facility] °[address] °[place], [place] °336-285-9073 ° °[facility]   °[address]     °[place], [place] [address]     °[phone]      °Admissions: 8am-3pm M-F ° °[facility] °[address] °[place],  [place] °336-227-7417 ° °[facility]: Residential Program (90 Days)   °[place], [place]      °336-725-8389 or [phone]    ° °[facility] °[place], [place] °(Walk in Hours over the weekend or by referral) ° °[facility] °[address], [place],  [address] °([phone] ° °Crisis Mobile: [facility]:  1-877-626-1772 (for crisis response 24 hours a day) °[facility] Hotline:      1-800-256-2452 °Outpatient Psychiatry and Counseling ° °[facility]: Mobile Crisis   Management 24 hours:  1-877-626-1772 ° °[facility] sliding scale fee and walk in schedule: M-F 8am-12pm/1pm-3pm °[address]  °[place], [place] [address] °[phone] ° °[facility] °[address] °[place], [place] [address] °336-703-9650 ° °[facility] ([facility])- new patient walk-in appointments available Monday - Friday 8am -3pm.          °[address] °[place], [place] [address] °336-676-6840 or crisis line- 336-676-6905 ° °[facility]/ Intensive Outpatient Therapy Program °[address] °[place], [place] [address] °336-832-9804 ° °[facility]                  °Crisis Services      °[phone]      °[address]     °[place], [place] [address]                ° °[facility]   °[facility] °[phone] °[address] °[place], [place] [address] ° ° °[facility]          °[address]  °[place], [place] [address]       °[phone] ° °[facility] °[address] °[place], [place] [address] °336-292-1510 ° °[facility]    °[address]    °[place], [place] [address]     °[phone]      ° °Parish McKinney, MD     °[address]     °[place] [address]     °336-282-1251     °  °[facility] °[address] °[place] [address] ° °[facility]     °[address]     °[place], [place]      °336-542-2076      ° °[facility] °Tina Ahluwalia, MD °[address] °[place], [place] [address] °[phone] ° °[facility]     °[address]     °[place], [place] [address]     °336-274-1237      ° °[facility] °[address] °[place], [place] [address] °336-854-4450 °Resources for Temporary Residential Assistance/Crisis Centers ° °DAY CENTERS °[facility] °M-F 8am-3pm   °[address] [place], [place] [address]   336-332-0824 °Services include: laundry, barbering, support groups, case management, phone  & computer access, showers, AA/NA mtgs, mental health/substance abuse nurse, job skills class, disability information, VA assistance, spiritual classes, etc.  ° °HOMELESS SHELTERS ° °[facility]     °[facility]   °[address], [place]     °336.271.5959       °       °[facility] (women and children)       °[address] °[place], [place] [address] °336-275-0820 °[email or web address] for application and process °Application Required ° °[facility]   °[address]    °[place] [address]     °[phone]       °             °[facility] °[address] °,  [address] °336.273.5572 °336-235-0363(schedule application appt.) °Application Required ° °[facility] (women only)    °[address]     °[place],  [address]     °336-884-1039      °  Intake starts 6pm daily Need valid ID, SSC, & Police report [employer] [address] [place], [place] [phone] Application Required  [facility] (men only)     [address]      [place], [place]     [phone]       [facility] [place] (Pregnant women only) [address] [place], [place] [phone]  [facility]      [address]      [place], [place] [address]     [phone]             [facility] [address] [place], [place] [phone] 90 day commitment/SA/Application process  [facility](men only)     [address]     [place], [place]     [phone]       Check-in at [facility] [address] [place], [place] [address] [phone] Men/Women/Women and Children must be there by 7 pm  [facility] [place], [place] [phone]     Please contact one of the following facilities to start medication management and therapy services:   [facility] at [facility] [address] ([address] [place])  [place], [place]  [address] Phone: [phone]  [facility]  [address] [place], [place] [address] Phone: [phone]  [facility]  [address]  [place], [place] [address]  [facility]  [address]  [place], [place] [address] Phone: [phone]

## 2022-05-23 NOTE — ED Triage Notes (Signed)
Pt presents to [facility] voluntarily, with complaints of depression. Pt reports having feelings of hopelessness, unrealistic thoughts and crying. Pt reports being out of Effexor for 14 days. 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 Pt states " I used initially to help cope, but now it does nothing for me".  Pt denies SI, HI, AVH.

## 2022-05-23 NOTE — ED Provider Notes (Signed)
Behavioral Health Urgent Care Medical Screening Exam  Patient Name: Tina Warren MRN: [id number] Date of Evaluation: 05/23/22 Chief Complaint:   Diagnosis:  Final diagnoses:  MDD (major depressive disorder), recurrent episode, moderate (HCC)    History of Present illness: Tina Warren is a 41 y.o. female patient presented to [facility] as a walk in alone requesting a refill for her Effexor.  Tina Warren, 41 y.o., female patient seen face to face by this provider, consulted with Dr. Lucianne Muss; and chart reviewed on 05/23/22.  Per chart review patient has had services in the past with [facility] behavioral outpatient services on the second floor.  Her last documented appointment was November 21, 2021 with Otila Back PA.    Patient reports that she is prescribed Effexor and that she recently ran out of Effexor 2 weeks ago which is incongruent with her last documented appointment time. Reports she experienced withdrawal symptoms but states they are resolved at this time.  She is requesting that her Effexor be restarted.  Explained to patient that medications should be refilled in an outpatient setting and medication would not be restarted at this time.  Discussed outpatient services on the second floor including open access walk-in hours.   Patient states she works full-time and she does not have transportation to present to an early morning walk-in visit.  She expressed her frustration that medication would not be refilled.  She also endorses cocaine use.  Reports she uses cocaine to cope she willingly takes outpatient and residential resources but states she is not interested in seeking treatment at this time.  During evaluation Tina Warren is observed sleeping in the lobby.  She is alert/oriented x4 and cooperative.  She has normal speech and makes good eye contact.  She is fairly groomed.  She endorses increased depression and anxiety and has a congruent affect.  She denies any concerns with  appetite or sleep.  She denies SI/HI/AVH.  Objectively she does not appear to be responding to internal/external stimuli.  She answered questions appropriately.    At this time Tina Warren is educated and verbalizes understanding of mental health resources and other crisis services in the community.  She is instructed to call 911 and present to the nearest emergency room should she experience any suicidal/homicidal ideation, auditory/visual/hallucinations, or detrimental worsening of her mental health condition.  She was a also advised by Clinical research associate that she could call the toll-free phone on insurance card to assist with identifying in network counselors and agencies or number on back of Medicaid card t speak with care coordinator    Psychiatric Specialty Exam  Presentation  General Appearance:Casual  Eye Contact:Good  Speech:Clear and Coherent; Normal Rate  Speech Volume:Normal  Handedness:Right   Mood and Affect  Mood:Anxious; Depressed Affect:Congruent  Thought Process  Thought Processes:Coherent Descriptions of Associations:Intact  Orientation:Full (Time, Place and Person)  Thought Content:Logical    Hallucinations:None  Ideas of Reference:None  Suicidal Thoughts:No  Homicidal Thoughts:No   Sensorium  Memory:Immediate Good; Remote Good; Recent Good Judgment:Good Insight:Good  Executive Functions  Concentration:Good Attention Span:Good Recall:Good Fund of Knowledge:Good Language:Good  Psychomotor Activity  Psychomotor Activity:Normal  Assets  Assets:Communication Skills; Desire for Improvement; Financial Resources/Insurance; Physical Health; Resilience; Social Support  Sleep  Sleep:Fair Number of hours: No data recorded  No data recorded  Physical Exam: Physical Exam Vitals and nursing note reviewed.  Constitutional:      General: She is not in acute distress.    Appearance: She is not ill-appearing.  HENT:  Head: Normocephalic.  Eyes:      General:        Right eye: No discharge.        Left eye: No discharge.     Conjunctiva/sclera: Conjunctivae normal.  Cardiovascular:     Rate and Rhythm: Normal rate.  Pulmonary:     Effort: Pulmonary effort is normal.  Musculoskeletal:        General: Normal range of motion.     Cervical back: Normal range of motion.  Skin:    Coloration: Skin is not jaundiced or pale.  Neurological:     Mental Status: She is alert and oriented to person, place, and time.  Psychiatric:        Attention and Perception: Attention and perception normal.        Mood and Affect: Mood is anxious and depressed.        Speech: Speech normal.        Behavior: Behavior normal. Behavior is cooperative.        Thought Content: Thought content normal.        Cognition and Memory: Cognition normal.        Judgment: Judgment normal.    Review of Systems  Constitutional: Negative.   HENT: Negative.    Eyes: Negative.   Respiratory: Negative.    Cardiovascular: Negative.   Musculoskeletal: Negative.   Skin: Negative.   Neurological: Negative.   Psychiatric/Behavioral:  Positive for depression and substance abuse. The patient is nervous/anxious.    Blood pressure 132/84, pulse 96, temperature 98.5 F (36.9 C), temperature source Oral, resp. rate 18, SpO2 100 %. There is no height or weight on file to calculate BMI.  Musculoskeletal: Strength & Muscle Tone: within normal limits Gait & Station: normal Patient leans: N/A   [facility] MSE Discharge Disposition for Follow up and Recommendations: Based on my evaluation the patient does not appear to have an emergency medical condition and can be discharged with resources and follow up care in outpatient services for Medication Management, Substance Abuse Intensive Outpatient Program, and Individual Therapy  Discharge patient.   Out patient psychiatric resources provided on avs.   No evidence of imminent risk to self or others at present.    Patient does not  meet criteria for psychiatric inpatient admission. Discussed crisis plan, support from social network, calling 911, coming to the Emergency Department, and calling Suicide Hotline.    Ardis Hughs, NP 05/23/2022, 1:58 PM

## 2023-01-07 ENCOUNTER — Ambulatory Visit (INDEPENDENT_AMBULATORY_CARE_PROVIDER_SITE_OTHER): Payer: Self-pay | Admitting: Physician Assistant

## 2023-01-07 ENCOUNTER — Encounter (HOSPITAL_COMMUNITY): Payer: Self-pay | Admitting: Physician Assistant

## 2023-01-07 ENCOUNTER — Other Ambulatory Visit (HOSPITAL_COMMUNITY): Payer: Self-pay | Admitting: Physician Assistant

## 2023-01-07 ENCOUNTER — Encounter (HOSPITAL_COMMUNITY): Payer: Self-pay

## 2023-01-07 ENCOUNTER — Ambulatory Visit (HOSPITAL_COMMUNITY): Payer: Self-pay | Admitting: Mental Health

## 2023-01-07 VITALS — BP 122/72 | HR 70 | Temp 97.7°F | Ht 65.0 in | Wt 254.6 lb

## 2023-01-07 DIAGNOSIS — F422 Mixed obsessional thoughts and acts: Secondary | ICD-10-CM | POA: Insufficient documentation

## 2023-01-07 DIAGNOSIS — F411 Generalized anxiety disorder: Secondary | ICD-10-CM

## 2023-01-07 DIAGNOSIS — F332 Major depressive disorder, recurrent severe without psychotic features: Secondary | ICD-10-CM

## 2023-01-07 DIAGNOSIS — F900 Attention-deficit hyperactivity disorder, predominantly inattentive type: Secondary | ICD-10-CM

## 2023-01-07 MED ORDER — SERTRALINE HCL 50 MG PO TABS: 150.0000 mg | ORAL_TABLET | Freq: Every day | ORAL | 2 refills | Status: DC

## 2023-01-07 MED ORDER — ATOMOXETINE HCL 40 MG PO CAPS: 40.0000 mg | ORAL_CAPSULE | Freq: Every day | ORAL | 2 refills | Status: DC

## 2023-01-07 MED ORDER — SERTRALINE HCL 50 MG PO TABS: 50.0000 mg | ORAL_TABLET | Freq: Every day | ORAL | 2 refills | Status: DC
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
## 2023-02-17 ENCOUNTER — Encounter (HOSPITAL_COMMUNITY): Payer: No Payment, Other | Admitting: Physician Assistant

## 2023-05-31 ENCOUNTER — Other Ambulatory Visit (HOSPITAL_COMMUNITY): Payer: Self-pay

## 2023-05-31 ENCOUNTER — Telehealth (HOSPITAL_COMMUNITY): Payer: Self-pay

## 2023-06-01 NOTE — Telephone Encounter (Signed)
Message acknowledged and reviewed.

## 2023-07-06 ENCOUNTER — Ambulatory Visit (INDEPENDENT_AMBULATORY_CARE_PROVIDER_SITE_OTHER): Payer: Self-pay | Admitting: Physician Assistant

## 2023-07-06 ENCOUNTER — Encounter (HOSPITAL_COMMUNITY): Payer: Self-pay | Admitting: Physician Assistant

## 2023-07-06 DIAGNOSIS — F332 Major depressive disorder, recurrent severe without psychotic features: Secondary | ICD-10-CM

## 2023-07-06 DIAGNOSIS — F422 Mixed obsessional thoughts and acts: Secondary | ICD-10-CM

## 2023-07-06 DIAGNOSIS — F900 Attention-deficit hyperactivity disorder, predominantly inattentive type: Secondary | ICD-10-CM

## 2023-07-06 DIAGNOSIS — F411 Generalized anxiety disorder: Secondary | ICD-10-CM

## 2023-07-06 MED ORDER — ATOMOXETINE HCL 40 MG PO CAPS: 40.0000 mg | ORAL_CAPSULE | Freq: Every day | ORAL | 2 refills | Status: DC

## 2023-07-06 MED ORDER — CLONAZEPAM 0.5 MG PO TABS: 0.2500 mg | ORAL_TABLET | Freq: Every day | ORAL | 0 refills | Status: AC | PRN

## 2023-07-06 MED ORDER — SERTRALINE HCL 50 MG PO TABS
ORAL_TABLET | ORAL | 1 refills | Status: DC
Start: 2023-07-06 — End: 2023-08-17

## 2023-07-06 NOTE — Progress Notes (Signed)
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  Patient was last seen by this provider on 01/07/2023.  During her last encounter, patient was being managed on the following psychiatric medications:  Sertraline 150 mg daily Strattera 40 mg daily  Patient reports that she ran out of medications roughly 3 weeks ago.  Despite running out of her medications, she reports that the medications were helpful at the time of taking them.  Patient reports that shortly after running out of her medications, she experienced some withdrawal symptoms.  Once her withdrawal symptoms subsided, patient reports that she experienced a resurgence of her depressive symptoms such as crying for no reason and suicidal ideations.  Patient states that her symptoms have been severe and that she has been experiencing progressive worsening of symptoms over the past 4 days.  Patient endorses the following depressive symptoms: crying spells, sadness, lack of motivation, and fatigue.  Patient also endorses elevated anxiety but denies any new stressors at this time.  A PHQ-9 screen was performed with the patient scoring a 23.  A GAD-7 screen was also performed with the patient scoring a 20.  Patient describes her mood as being irritable for no reason.  Patient denies suicidal or homicidal ideations.  She further denies auditory or visual hallucinations and does not appear to be responding to internal/external stimuli.   Patient endorses poor sleep and receives on average 1 to 2 hours of sleep per night.  Patient endorses excessive eating stating that she has been eating all the time.  Patient denies alcohol consumption or illicit drug use.  Patient endorses tobacco use and smokes on average 2 cigarettes/day.  Visit Diagnosis:    ICD-10-CM   1. Attention deficit hyperactivity disorder (ADHD), predominantly inattentive type  F90.0 atomoxetine (STRATTERA) 40 MG capsule    2. Severe episode of recurrent major depressive disorder, without psychotic features (HCC)  F33.2 sertraline (ZOLOFT) 50 MG tablet    3. Generalized anxiety disorder  F41.1 sertraline (ZOLOFT) 50 MG tablet    clonazePAM (KLONOPIN) 0.5 MG tablet    4. Mixed obsessional thoughts and acts  F42.2 sertraline (ZOLOFT) 50 MG tablet      Past Psychiatric History:  Patient reports that she has a past history of PTSD, depression, anxiety, and OCD   Patient denies a past history of hospitalization due to mental health   Past history of suicide: Patient reports that she attempted suicide back when she was 42 years of age but has not tried since then   Patient denies a past history of homicide   Medication trials: Venlafaxine, Strattera  Past Medical History: History reviewed. No pertinent past medical history.  Past Surgical History:  Procedure Laterality Date   TONSILLECTOMY      Family Psychiatric History:  Patient states that there is evidence of psychiatric illness that runs in her family but is unsure of what family members have been diagnosed with a mental illness   Family history of suicide attempt: Patient states that her older brother killed himself Family history of homicide:  Patient denies Family history of substance abuse: Patient reports that she has 6 brother and 3 sisters and all of them struggled with some form of substance abuse. Patient reports that her family member struggled with alcoholism, marijuana, and cocaine  use.  Family History:  Family History  Family history unknown: Yes    Social History:  Social History   Socioeconomic History   Marital status: Married    Spouse name: Not on file   Number of children: Not on file   Years of education: Not on file   Highest education level: Not on file  Occupational History   Not on file  Tobacco Use   Smoking status: Every Day    Current packs/day: 0.25    Types: Cigarettes   Smokeless tobacco: Never  Vaping Use   Vaping status: Never Used  Substance and Sexual Activity   Alcohol use: Yes    Comment: rarely   Drug use: No   Sexual activity: Yes    Birth control/protection: Surgical  Other Topics Concern   Not on file  Social History Narrative   Not on file   Social Determinants of Health   Financial Resource Strain: Not on file  Food Insecurity: Not on file  Transportation Needs: Not on file  Physical Activity: Not on file  Stress: Not on file  Social Connections: Not on file    Allergies:  Allergies  Allergen Reactions   Codeine Nausea Only and Nausea And Vomiting   Penicillins     Childhood Allergy Has patient had a PCN reaction causing immediate rash, facial/tongue/throat swelling, SOB or lightheadedness with hypotension: Unknown Has patient had a PCN reaction causing severe rash involving mucus membranes or skin necrosis: Unknown Has patient had a PCN reaction that required hospitalization: Unknown Has patient had a PCN reaction occurring within the last 10 years: Unknown If all of the above answers are "NO", then may proceed with Cephalosporin use.     Metabolic Disorder Labs: No results found for: "HGBA1C", "MPG" No results found for: "PROLACTIN" No results found for: "CHOL", "TRIG", "HDL", "CHOLHDL", "VLDL", "LDLCALC" No results found for: "TSH"  Therapeutic Level Labs: No results found for: "LITHIUM" No results found for: "VALPROATE" No results found for: "CBMZ"  Current Medications: Current Outpatient  Medications  Medication Sig Dispense Refill   ARIPiprazole (ABILIFY) 2 MG tablet Take 1 tablet (2 mg total) by mouth at bedtime. 30 tablet 2   atomoxetine (STRATTERA) 40 MG capsule Take 1 capsule (40 mg total) by mouth daily. 30 capsule 2   clonazePAM (KLONOPIN) 0.5 MG tablet Take 0.5 tablets (0.25 mg total) by mouth daily as needed for anxiety. 4 tablet 0   hydrOXYzine (ATARAX/VISTARIL) 10 MG tablet Take 1 tablet (10 mg total) by mouth 2 (two) times daily as needed for anxiety. 30 tablet 2   sertraline (ZOLOFT) 50 MG tablet Take 1 tablet (50 mg total) by mouth daily for 6 days, THEN 2 tablets (100 mg total) daily for 6 days, THEN 3 tablets (150 mg total) daily. 90 tablet 1   No current facility-administered medications for this visit.     Musculoskeletal: Strength & Muscle Tone: within normal limits Gait & Station: normal Patient leans: N/A  Psychiatric Specialty Exam: Review of Systems  Psychiatric/Behavioral:  Positive for dysphoric mood and sleep disturbance. Negative for decreased concentration, hallucinations, self-injury and suicidal ideas. The patient is nervous/anxious. The patient is not hyperactive.     Blood pressure (!) 143/83, pulse 76, height 5\' 5"  (  1.651 m), weight 249 lb (112.9 kg), SpO2 100%.Body mass index is 41.44 kg/m.  General Appearance: Well Groomed  Eye Contact:  Good  Speech:  Clear and Coherent and Normal Rate  Volume:  Normal  Mood:  Anxious, Depressed, and Dysphoric  Affect:  Congruent and Depressed  Thought Process:  Coherent, Goal Directed, and Descriptions of Associations: Intact  Orientation:  Full (Time, Place, and Person)  Thought Content: WDL   Suicidal Thoughts:  No  Homicidal Thoughts:  No  Memory:  Immediate;   Good Recent;   Good Remote;   Fair  Judgement:  Good  Insight:  Good  Psychomotor Activity:  Normal  Concentration:  Concentration: Good and Attention Span: Good  Recall:  Good  Fund of Knowledge: Good  Language: Good   Akathisia:  No  Handed:  Right  AIMS (if indicated): not done  Assets:  Communication Skills Desire for Improvement Housing Social Support Transportation Vocational/Educational  ADL's:  Intact  Cognition: WNL  Sleep:  Poor   Screenings: GAD-7    Flowsheet Row Clinical Support from 07/06/2023 in [facility] Office Visit from 01/07/2023 in [facility] Video Visit from 11/21/2021 in [facility] Office Visit from 09/24/2021 in [facility]  Total GAD-7 Score 20 19 16 20       PHQ2-9    Flowsheet Row Clinical Support from 07/06/2023 in [facility] Office Visit from 01/07/2023 in [facility] Video Visit from 11/21/2021 in [facility] Office Visit from 09/24/2021 in [facility]  PHQ-2 Total Score 6 6 4 6   PHQ-9 Total Score 22 22 19 21       Flowsheet Row Clinical Support from 07/06/2023 in [facility] Office Visit from 01/07/2023 in [facility] from 03/19/2022 in 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  Patient presents to the encounter stating that she has run out of her medications.  She reports that she experienced withdrawal symptoms shortly after running out of her medications but those symptoms have since subsided.  Patient  continues to endorse worsening ongoing depressive symptoms as well as elevated anxiety.  Patient would like to be restarted back on her medications.  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  Patient to be placed on Zoloft 50 mg for 6 days followed by 100 mg for 6 more days, then 150 mg daily for the management of her depressive symptoms and anxiety.  Patient was agreeable to recommendations.  Patient's medications to be e-prescribed to pharmacy of choice.  Collaboration of Care: Collaboration of Care: Medication Management AEB provider managing patient's psychiatric medications and Psychiatrist AEB patient being followed by a mental health provider at this facility  Patient/Guardian was advised Release of Information must be obtained prior to any record release in order to collaborate their  care with an outside provider. Patient/Guardian was advised if they have not already done so to contact the registration department to sign all necessary forms in order for [place] to release information regarding their care.   Consent: Patient/Guardian gives verbal consent for treatment and assignment of benefits for services provided during this visit. Patient/Guardian expressed understanding and agreed to proceed.   1. Attention deficit hyperactivity disorder (ADHD), predominantly inattentive type  - atomoxetine (STRATTERA) 40 MG capsule; Take 1 capsule (40 mg total) by mouth daily.  Dispense: 30 capsule; Refill: 2  2. Severe episode of recurrent major depressive disorder, without psychotic features (HCC)  - sertraline (ZOLOFT) 50 MG tablet; Take 1 tablet (50 mg total) by mouth daily for 6 days, THEN 2 tablets (100 mg total) daily for 6 days, THEN 3 tablets (150 mg total) daily.  Dispense: 90 tablet; Refill: 1  3. Generalized anxiety  disorder  - sertraline (ZOLOFT) 50 MG tablet; Take 1 tablet (50 mg total) by mouth daily for 6 days, THEN 2 tablets (100 mg total) daily for 6 days, THEN 3 tablets (150 mg total) daily.  Dispense: 90 tablet; Refill: 1 - clonazePAM (KLONOPIN) 0.5 MG tablet; Take 0.5 tablets (0.25 mg total) by mouth daily as needed for anxiety.  Dispense: 4 tablet; Refill: 0  4. Mixed obsessional thoughts and acts  - sertraline (ZOLOFT) 50 MG tablet; Take 1 tablet (50 mg total) by mouth daily for 6 days, THEN 2 tablets (100 mg total) daily for 6 days, THEN 3 tablets (150 mg total) daily.  Dispense: 90 tablet; Refill: 1  Patient to follow up in 6 weeks Provider spent a total of 23 minutes with the patient/reviewing the patient's chart  Meta Hatchet, PA 07/06/2023, 9:45 AM

## 2023-08-17 ENCOUNTER — Ambulatory Visit (INDEPENDENT_AMBULATORY_CARE_PROVIDER_SITE_OTHER): Payer: No Payment, Other | Admitting: Physician Assistant

## 2023-08-17 ENCOUNTER — Encounter (HOSPITAL_COMMUNITY): Payer: Self-pay | Admitting: Physician Assistant

## 2023-08-17 DIAGNOSIS — F900 Attention-deficit hyperactivity disorder, predominantly inattentive type: Secondary | ICD-10-CM | POA: Diagnosis not present

## 2023-08-17 DIAGNOSIS — F422 Mixed obsessional thoughts and acts: Secondary | ICD-10-CM | POA: Diagnosis not present

## 2023-08-17 DIAGNOSIS — F332 Major depressive disorder, recurrent severe without psychotic features: Secondary | ICD-10-CM

## 2023-08-17 DIAGNOSIS — F411 Generalized anxiety disorder: Secondary | ICD-10-CM | POA: Diagnosis not present

## 2023-08-17 MED ORDER — REXULTI 0.5 MG PO TABS
0.5000 mg | ORAL_TABLET | Freq: Every day | ORAL | 0 refills | Status: AC
Start: 2023-08-17 — End: ?

## 2023-08-17 MED ORDER — SERTRALINE HCL 50 MG PO TABS
150.0000 mg | ORAL_TABLET | Freq: Every day | ORAL | 1 refills | Status: AC
Start: 2023-08-17 — End: 2024-08-16

## 2023-08-17 MED ORDER — ATOMOXETINE HCL 60 MG PO CAPS
60.0000 mg | ORAL_CAPSULE | Freq: Every day | ORAL | 1 refills | Status: AC
Start: 2023-08-17 — End: 2024-08-16

## 2023-08-17 NOTE — Progress Notes (Signed)
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  Patient is currently being managed on the following psychiatric medications:  Sertraline 150 mg daily Strattera 40 mg daily  Patient presents to the encounter stating that she is not as depressed as she was before.  She reports that her main concern is that she feels scatterbrained and is unable to complete her tasks.  She reports that her inability to complete tasks is a source of anxiety for her.  She reports being unable to stay on topic and will often start one activity, stop it abruptly, and move to another activity or do 4 different activities at once.  Patient also endorses issues with procrastination.  Prior to the conclusion of the encounter, patient reported that she was also experiencing paranoia.  She reports that her paranoia is characterized by being highly suspicious of people around her.  She also endorses paranoid thoughts stating that she believed that someone placed a recorder in her apartment.  She admits to using her friends Rexulti prescription and states that the medication was helpful in managing her paranoia.  Patient is interested in being placed on Rexulti following the conclusion of the encounter.  A PHQ-9 screen was performed with the patient scoring a 14.  A GAD-7 screen was also performed with the patient scoring an 11.  Patient describes her mood as being irritable for no reason.  Patient denies suicidal or homicidal  ideations.  Patient endorses neutral mood.  Patient denies suicidal or homicidal ideations.  She further denies auditory or visual hallucinations and does not appear to be responding to internal/external stimuli.  Patient endorses hypersomnia and receives on average every 11 hours of sleep per night.  Patient endorses good appetite and eats on average 2-3 meals per day along with snacking in between.  Patient denies alcohol consumption or illicit drug use.  Patient endorses tobacco use and smokes on average 2 cigarettes/day.  Visit Diagnosis:    ICD-10-CM   1. Severe episode of recurrent major depressive disorder, without psychotic features (HCC)  F33.2 sertraline (ZOLOFT) 50 MG tablet    2. Generalized anxiety disorder  F41.1 sertraline (ZOLOFT) 50 MG tablet    3. Mixed obsessional thoughts and acts  F42.2 sertraline (ZOLOFT) 50 MG tablet    4. Attention deficit hyperactivity disorder (ADHD), predominantly inattentive type  F90.0 atomoxetine (STRATTERA) 60 MG capsule       Past Psychiatric History:  Patient reports that she has a past history of PTSD, depression, anxiety, and OCD   Patient denies a past history of hospitalization due to mental health   Past history of suicide: Patient reports that she attempted suicide back when she was 42 years of age but has not tried since then   Patient denies a past history of homicide   Medication trials: Venlafaxine, Strattera  Past Medical History: History reviewed. No pertinent past medical history.  Past Surgical History:  Procedure Laterality Date   TONSILLECTOMY      Family Psychiatric History:  Patient states that there is evidence of psychiatric illness that  runs in her family but is unsure of what family members have been diagnosed with a mental illness   Family history of suicide attempt: Patient states that her older brother killed himself Family history of homicide: Patient denies Family history of substance abuse: Patient  reports that she has 6 brother and 3 sisters and all of them struggled with some form of substance abuse. Patient reports that her family member struggled with alcoholism, marijuana, and cocaine use.  Family History:  Family History  Family history unknown: Yes    Social History:  Social History   Socioeconomic History   Marital status: Married    Spouse name: Not on file   Number of children: Not on file   Years of education: Not on file   Highest education level: Not on file  Occupational History   Not on file  Tobacco Use   Smoking status: Every Day    Current packs/day: 0.25    Types: Cigarettes   Smokeless tobacco: Never  Vaping Use   Vaping status: Never Used  Substance and Sexual Activity   Alcohol use: Yes    Comment: rarely   Drug use: No   Sexual activity: Yes    Birth control/protection: Surgical  Other Topics Concern   Not on file  Social History Narrative   Not on file   Social Determinants of Health   Financial Resource Strain: Not on file  Food Insecurity: Not on file  Transportation Needs: Not on file  Physical Activity: Not on file  Stress: Not on file  Social Connections: Not on file    Allergies:  Allergies  Allergen Reactions   Codeine Nausea Only and Nausea And Vomiting   Penicillins     Childhood Allergy Has patient had a PCN reaction causing immediate rash, facial/tongue/throat swelling, SOB or lightheadedness with hypotension: Unknown Has patient had a PCN reaction causing severe rash involving mucus membranes or skin necrosis: Unknown Has patient had a PCN reaction that required hospitalization: Unknown Has patient had a PCN reaction occurring within the last 10 years: Unknown If all of the above answers are "NO", then may proceed with Cephalosporin use.     Metabolic Disorder Labs: No results found for: "HGBA1C", "MPG" No results found for: "PROLACTIN" No results found for: "CHOL", "TRIG", "HDL", "CHOLHDL", "VLDL", "LDLCALC" No  results found for: "TSH"  Therapeutic Level Labs: No results found for: "LITHIUM" No results found for: "VALPROATE" No results found for: "CBMZ"  Current Medications: Current Outpatient Medications  Medication Sig Dispense Refill   ARIPiprazole (ABILIFY) 2 MG tablet Take 1 tablet (2 mg total) by mouth at bedtime. 30 tablet 2   atomoxetine (STRATTERA) 60 MG capsule Take 1 capsule (60 mg total) by mouth daily. 30 capsule 1   clonazePAM (KLONOPIN) 0.5 MG tablet Take 0.5 tablets (0.25 mg total) by mouth daily as needed for anxiety. 4 tablet 0   hydrOXYzine (ATARAX/VISTARIL) 10 MG tablet Take 1 tablet (10 mg total) by mouth 2 (two) times daily as needed for anxiety. 30 tablet 2   sertraline (ZOLOFT) 50 MG tablet Take 3 tablets (150 mg total) by mouth daily. 90 tablet 1   No current facility-administered medications for this visit.     Musculoskeletal: Strength & Muscle Tone: within normal limits Gait & Station: normal Patient leans: N/A  Psychiatric Specialty Exam: Review of Systems  Psychiatric/Behavioral:  Positive for decreased concentration, dysphoric mood and sleep disturbance. Negative for hallucinations, self-injury and suicidal ideas. The patient is nervous/anxious. The  patient is not hyperactive.     Blood pressure (!) 144/85, pulse (!) 101, temperature 99 F (37.2 C), temperature source Oral, height 5' 5.5" (1.664 m), weight 242 lb 9.6 oz (110 kg).Body mass index is 39.76 kg/m.  General Appearance: Well Groomed  Eye Contact:  Good  Speech:  Clear and Coherent and Normal Rate  Volume:  Normal  Mood:  Anxious, Depressed, and Dysphoric  Affect:  Congruent  Thought Process:  Coherent, Goal Directed, and Descriptions of Associations: Intact  Orientation:  Full (Time, Place, and Person)  Thought Content: Paranoid Ideation   Suicidal Thoughts:  No  Homicidal Thoughts:  No  Memory:  Immediate;   Good Recent;   Good Remote;   Fair  Judgement:  Good  Insight:  Good   Psychomotor Activity:  Normal  Concentration:  Concentration: Good and Attention Span: Good  Recall:  Good  Fund of Knowledge: Good  Language: Good  Akathisia:  No  Handed:  Right  AIMS (if indicated): not done  Assets:  Communication Skills Desire for Improvement Housing Social Support Transportation Vocational/Educational  ADL's:  Intact  Cognition: WNL  Sleep:  Fair   Screenings: GAD-7    Flowsheet Row Clinical Support from 08/17/2023 in [facility] Clinical Support from 07/06/2023 in [facility] Office Visit from 01/07/2023 in [facility] Video Visit from 11/21/2021 in [facility] Office Visit from 09/24/2021 in [facility]  Total GAD-7 Score 11 20 19 16 20       PHQ2-9    Flowsheet Row Clinical Support from 08/17/2023 in [facility] Clinical Support from 07/06/2023 in [facility] Office Visit from 01/07/2023 in [facility] Video Visit from 11/21/2021 in 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  Patient presents to the encounter stating that she has been having issues with focus/concentration.  She reports that she is unable to complete her tasks as well as endorsing the inability to stay on task.  She reports that she will often find herself doing for activities at once but not getting anything accomplished.  Patient also endorses ongoing paranoia characterized by being highly suspicious of people.  She reports that she recently tried her friends Rexulti prescription and states that it helped minimize her paranoia.  Patient is interested in being placed on Rexulti as an adjunct to the management of her depression and management of her paranoia.  Provider to provide patient with a sample 14-day supply of Rexulti 0.5 to 1 mg daily for the management of her depression and paranoia.  Provider also recommended increasing patient's Strattera dosage from 40 mg to 60 mg  daily for the management of her focus/concentration.  Patient was agreeable to recommendation.  Patient's medications to be e-prescribed to pharmacy of choice.  Collaboration of Care: Collaboration of Care: Medication Management AEB provider managing patient's psychiatric medications and Psychiatrist AEB patient being followed by a mental health provider at this facility  Patient/Guardian was advised Release of Information must be obtained prior to any record release in order to collaborate their care with an outside provider. Patient/Guardian was advised if they have not already done so to contact the registration department to sign all necessary forms in order for [place] to release information regarding their care.   Consent: Patient/Guardian gives verbal consent for treatment and assignment of benefits for services provided during this visit. Patient/Guardian expressed understanding and agreed to proceed.   1. Severe episode of recurrent major depressive disorder, without psychotic  features (HCC) Patient to be placed on a 14-day sample supply of Rexulti 0.5 to 1 mg daily for the management of her major depressive disorder  - sertraline (ZOLOFT) 50 MG tablet; Take 3 tablets (150 mg total) by mouth daily.  Dispense: 90 tablet; Refill: 1  2. Generalized anxiety disorder  - sertraline (ZOLOFT) 50 MG tablet; Take 3 tablets (150 mg total) by mouth daily.  Dispense: 90 tablet; Refill: 1  3. Mixed obsessional thoughts and acts  - sertraline (ZOLOFT) 50 MG tablet; Take 3 tablets (150 mg total) by mouth daily.  Dispense: 90 tablet; Refill: 1  4. Attention deficit hyperactivity disorder (ADHD), predominantly inattentive type  - atomoxetine (STRATTERA) 60 MG capsule; Take 1 capsule (60 mg total) by mouth daily.  Dispense: 30 capsule; Refill: 1  Patient to follow up in 6 weeks Provider spent a total of 15 minutes with the patient/reviewing the patient's chart  Meta Hatchet, PA 08/17/2023, 9:47 PM

## 2023-09-28 ENCOUNTER — Encounter (HOSPITAL_COMMUNITY): Payer: No Payment, Other | Admitting: Physician Assistant
